# Patient Record
Sex: Female | Born: 1935 | Race: White | Hispanic: No | Smoking: Current every day smoker
Health system: Southern US, Community
[De-identification: ages and names within clinical notes are randomized; demographics above are authoritative.]

## PROBLEM LIST (undated history)

## (undated) DIAGNOSIS — I6529 Occlusion and stenosis of unspecified carotid artery: Secondary | ICD-10-CM

## (undated) DIAGNOSIS — E785 Hyperlipidemia, unspecified: Secondary | ICD-10-CM

## (undated) DIAGNOSIS — M810 Age-related osteoporosis without current pathological fracture: Secondary | ICD-10-CM

## (undated) DIAGNOSIS — E569 Vitamin deficiency, unspecified: Secondary | ICD-10-CM

## (undated) DIAGNOSIS — F0151 Vascular dementia with behavioral disturbance: Secondary | ICD-10-CM

## (undated) DIAGNOSIS — H409 Unspecified glaucoma: Secondary | ICD-10-CM

## (undated) DIAGNOSIS — G2581 Restless legs syndrome: Secondary | ICD-10-CM

## (undated) DIAGNOSIS — R634 Abnormal weight loss: Secondary | ICD-10-CM

## (undated) DIAGNOSIS — F329 Major depressive disorder, single episode, unspecified: Principal | ICD-10-CM

## (undated) DIAGNOSIS — I739 Peripheral vascular disease, unspecified: Secondary | ICD-10-CM

## (undated) DIAGNOSIS — F0150 Vascular dementia without behavioral disturbance: Secondary | ICD-10-CM

## (undated) DIAGNOSIS — F0153 Vascular dementia, unspecified severity, with mood disturbance: Secondary | ICD-10-CM

## (undated) HISTORY — DX: Vascular dementia with behavioral disturbance: F01.51

## (undated) HISTORY — DX: Vitamin deficiency, unspecified: E56.9

## (undated) HISTORY — DX: Age-related osteoporosis without current pathological fracture: M81.0

## (undated) HISTORY — DX: Occlusion and stenosis of unspecified carotid artery: I65.29

## (undated) HISTORY — DX: Vascular dementia without behavioral disturbance: F01.50

## (undated) HISTORY — PX: APPENDECTOMY: SHX54

## (undated) HISTORY — DX: Major depressive disorder, single episode, unspecified: F32.9

## (undated) HISTORY — DX: Peripheral vascular disease, unspecified: I73.9

## (undated) HISTORY — DX: Unspecified glaucoma: H40.9

## (undated) HISTORY — DX: Abnormal weight loss: R63.4

## (undated) HISTORY — DX: Hyperlipidemia, unspecified: E78.5

## (undated) HISTORY — DX: Restless legs syndrome: G25.81

## (undated) HISTORY — DX: Vascular dementia, unspecified severity, with mood disturbance: F01.53

---

## 1968-07-14 HISTORY — PX: CAROTID ENDARTERECTOMY: SUR193

## 2006-11-16 HISTORY — PX: COLONOSCOPY: SHX174

## 2007-08-25 ENCOUNTER — Ambulatory Visit: Payer: Self-pay | Admitting: *Deleted

## 2007-09-13 ENCOUNTER — Ambulatory Visit: Payer: Self-pay | Admitting: Vascular Surgery

## 2010-04-15 ENCOUNTER — Encounter: Admission: RE | Admit: 2010-04-15 | Discharge: 2010-04-15 | Payer: Self-pay | Admitting: Family Medicine

## 2010-04-21 ENCOUNTER — Encounter: Admission: RE | Admit: 2010-04-21 | Discharge: 2010-04-21 | Payer: Self-pay | Admitting: Family Medicine

## 2010-05-20 ENCOUNTER — Emergency Department (HOSPITAL_COMMUNITY): Admission: EM | Admit: 2010-05-20 | Discharge: 2010-05-20 | Payer: Self-pay | Admitting: Emergency Medicine

## 2011-03-31 NOTE — Procedures (Signed)
CAROTID DUPLEX EXAM   INDICATION:  Left carotid bruit.  Patient is also status post a right  carotid endarterectomy in the past in [place], [place].   HISTORY:  Diabetes:  No.  Cardiac:  No.  Hypertension:  No.  Smoking:  Yes, one pack per day for 60+ years.  Previous Surgery:  Right carotid endarterectomy in the past in  [place], [place].  CV History:  No  Amaurosis Fugax No, Paresthesias No, Hemiparesis No                                       RIGHT             LEFT  Brachial systolic pressure:         189               196  Brachial Doppler waveforms:         Biphasic          Biphasic  Vertebral direction of flow:        Antegrade         Antegrade  DUPLEX VELOCITIES (cm/sec)  CCA peak systolic                   88                76  ECA peak systolic                   56                129  ICA peak systolic                   64                89  ICA end diastolic                   19                24  PLAQUE MORPHOLOGY:                  Mixed             Calcified with  shadowing on the left  PLAQUE AMOUNT:                      Mild              Moderate  PLAQUE LOCATION:                    Distal CCA, ICA   ICA, ECA   IMPRESSION:  1. 20-39% right internal carotid artery stenosis, status post      endarterectomy.  2. 20-39% internal carotid artery stenosis; however, calcified plaque      with shadowing could obscure a more severe stenosis.   A preliminary copy was faxed to Dr. Stephannie Peters office.   ___________________________________________  Seth Bake. Charlena Cross, MD   DP/MEDQ  D:  08/25/2007  T:  08/26/2007  Job:  [id number]

## 2011-03-31 NOTE — Consult Note (Signed)
VASCULAR SURGERY CONSULTATION   [place], Minnetta  DOB:  1924/06/01                                       09/13/2007  CHART#:[id number]   HISTORY OF PRESENT ILLNESS:  I saw Ms. Ayre in the office today in  consultation for her peripheral vascular disease.  She was referred by  Dr. Smith Mince.  This is a pleasant 77 year old woman who had undergone a  right carotid endarterectomy in [place] in the past.  She has done  well from that standpoint.  She had developed pain in the lower  extremities, most significantly in the left knee.  She was referred for  vascular consultation.  Of note, she has no history of stroke, TIA,  expressive or receptive aphasia, or amaurosis fugax.  I did not get any  clear cut history of calf claudication, rest pain, or nonhealing wounds.  Her pain is mostly in the left knee and also intermittently in the left  thigh.  Lying down and sitting seem to help the pain.  Sometimes walking  actually helps the pain in the knee.  She does have a history of lumbar  disk disease also.   PAST MEDICAL HISTORY:  Hyperlipidemia.  She denies any history of  diabetes, hypertension, history of previous myocardial infarction,  history of congestive heart failure, or history of COPD.   FAMILY HISTORY:  There is no history of premature cardiovascular  disease.   SOCIAL HISTORY:  She is widowed.  She has one child.  She smokes a pack  per day of cigarettes and has been smoking for 60 years.   MEDICATIONS:  Listed on her medical history form in her chart.   REVIEW OF SYSTEMS:  GI:  She has had problems with intermittent  constipation.  VASCULAR:  She has had no history of DVT or phlebitis.  She has had no  real history of claudication or rest pain.  ORTHOPAEDIC:  She does have a history of arthritis joint pain and muscle  pain.  GENERAL, CARDIAC, PULMONARY, GU, NEUROLOGIC, PSYCHIATRY, ENT AND  HEMATOLOGIC:  Unremarkable as documented on the medical  history form in  her chart.   PHYSICAL EXAMINATION:  Vital signs:  Blood pressure 179/75, heart rate  98.  HEENT:  There is no cervical lymphadenopathy.  I did not detect any  carotid bruits.  Chest:  Lungs are clear to auscultation bilaterally to  auscultation.  Cardiac:  She has a regular rate and rhythm.  Abdomen:  Soft and nontender.  Vascular:  She has no femoral pulses.  I could not  palpate popliteal or pedal pulses on either side.  Extremities:  No  significant lower extremity swelling.  Neurologic:  She has good  strength in her upper extremities and lower extremities bilaterally.   LABORATORY DATA:  Carotid duplex study shows less than 39% carotid  stenoses bilaterally.  Her right carotid endarterectomy site is widely  patent without evidence of recurrent stenosis.  She has had Doppler  studies in our office on August 25, 2007 which showed monophasic Doppler  signals in both feet and an ABI of 53% on the right and 52% on the left.   With respect to her carotids, I reassured her that she has no evidence  of recurrent carotid disease and I do not think routine followup studies  are necessary unless she develops any  recurrent symptoms.   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  I do not think this would  explain her knee pain or thigh pain and I think most of this could be  related to her back or to her arthritis.  I have encouraged her to stay  as active as possible and to walk as much as possible.  We have also  discussed the importance of tobacco cessation.   I will be happy to see her back at any time if her symptoms progress,  but at this point, I do not think revascularization would significantly  improve her symptoms.  Certainly if she continues to smoke, her  peripheral vascular disease could progress and this could become a more  significant issue.   Di Kindle. Edilia Bo, M.D.  Electronically  Signed  CSD/MEDQ  D:  09/13/2007  T:  09/14/2007  Job:  [id number]   cc:   Talmadge Coventry, M.D.

## 2013-03-02 ENCOUNTER — Ambulatory Visit (INDEPENDENT_AMBULATORY_CARE_PROVIDER_SITE_OTHER): Payer: Medicare PPO | Admitting: Internal Medicine

## 2013-03-02 ENCOUNTER — Encounter: Payer: Self-pay | Admitting: Internal Medicine

## 2013-03-02 VITALS — BP 146/78 | HR 52 | Temp 98.0°F | Resp 14 | Ht 61.5 in | Wt 118.2 lb

## 2013-03-02 DIAGNOSIS — I739 Peripheral vascular disease, unspecified: Secondary | ICD-10-CM

## 2013-03-02 DIAGNOSIS — E785 Hyperlipidemia, unspecified: Secondary | ICD-10-CM

## 2013-03-02 DIAGNOSIS — M810 Age-related osteoporosis without current pathological fracture: Secondary | ICD-10-CM

## 2013-03-02 DIAGNOSIS — F32A Depression, unspecified: Secondary | ICD-10-CM

## 2013-03-02 DIAGNOSIS — F17200 Nicotine dependence, unspecified, uncomplicated: Secondary | ICD-10-CM

## 2013-03-02 DIAGNOSIS — R634 Abnormal weight loss: Secondary | ICD-10-CM | POA: Insufficient documentation

## 2013-03-02 DIAGNOSIS — H409 Unspecified glaucoma: Secondary | ICD-10-CM

## 2013-03-02 DIAGNOSIS — I6529 Occlusion and stenosis of unspecified carotid artery: Secondary | ICD-10-CM | POA: Insufficient documentation

## 2013-03-02 DIAGNOSIS — F0153 Vascular dementia, unspecified severity, with mood disturbance: Secondary | ICD-10-CM | POA: Insufficient documentation

## 2013-03-02 DIAGNOSIS — F329 Major depressive disorder, single episode, unspecified: Secondary | ICD-10-CM | POA: Insufficient documentation
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
## 2013-03-03 LAB — CBC WITH DIFFERENTIAL/PLATELET
Basophils Absolute: 0 10*3/uL (ref 0.0–0.2)
Basos: 0 % (ref 0–3)
Eos: 1 % (ref 0–5)
Eosinophils Absolute: 0.1 10*3/uL (ref 0.0–0.4)
HCT: 43 % (ref 34.0–46.6)
Hemoglobin: 14.7 g/dL (ref 11.1–15.9)
Immature Grans (Abs): 0 10*3/uL (ref 0.0–0.1)
Immature Granulocytes: 0 % (ref 0–2)
Lymphocytes Absolute: 2.4 10*3/uL (ref 0.7–3.1)
Lymphs: 24 % (ref 14–46)
MCH: 31.3 pg (ref 26.6–33.0)
MCHC: 34.2 g/dL (ref 31.5–35.7)
MCV: 92 fL (ref 79–97)
Monocytes Absolute: 0.8 10*3/uL (ref 0.1–0.9)
Monocytes: 8 % (ref 4–12)
Neutrophils Absolute: 6.8 10*3/uL (ref 1.4–7.0)
Neutrophils Relative %: 67 % (ref 40–74)
RBC: 4.7 x10E6/uL (ref 3.77–5.28)
RDW: 12.5 % (ref 12.3–15.4)
WBC: 10.2 10*3/uL (ref 3.4–10.8)

## 2013-03-03 LAB — BASIC METABOLIC PANEL
BUN/Creatinine Ratio: 18 (ref 11–26)
BUN: 12 mg/dL (ref 8–27)
CO2: 27 mmol/L (ref 19–28)
Calcium: 9.8 mg/dL (ref 8.6–10.2)
Chloride: 101 mmol/L (ref 97–108)
Creatinine, Ser: 0.68 mg/dL (ref 0.57–1.00)
GFR calc Af Amer: 91 mL/min/{1.73_m2} (ref >59)
GFR calc non Af Amer: 79 mL/min/{1.73_m2} (ref >59)
Glucose: 82 mg/dL (ref 65–99)
Potassium: 4.9 mmol/L (ref 3.5–5.2)
Sodium: 141 mmol/L (ref 134–144)

## 2013-03-20 ENCOUNTER — Other Ambulatory Visit: Payer: Self-pay | Admitting: Internal Medicine

## 2013-03-21 ENCOUNTER — Other Ambulatory Visit: Payer: Self-pay | Admitting: Internal Medicine

## 2013-05-24 ENCOUNTER — Encounter: Payer: Self-pay | Admitting: *Deleted

## 2013-05-25 ENCOUNTER — Encounter: Payer: Self-pay | Admitting: Internal Medicine

## 2013-05-25 ENCOUNTER — Ambulatory Visit (INDEPENDENT_AMBULATORY_CARE_PROVIDER_SITE_OTHER): Payer: Medicare PPO | Admitting: Internal Medicine

## 2013-05-25 VITALS — BP 152/78 | HR 75 | Temp 97.4°F | Resp 16 | Ht 61.5 in | Wt 115.0 lb

## 2013-05-25 DIAGNOSIS — E785 Hyperlipidemia, unspecified: Secondary | ICD-10-CM

## 2013-05-25 DIAGNOSIS — F32A Depression, unspecified: Secondary | ICD-10-CM

## 2013-05-25 DIAGNOSIS — F17200 Nicotine dependence, unspecified, uncomplicated: Secondary | ICD-10-CM

## 2013-05-25 DIAGNOSIS — R634 Abnormal weight loss: Secondary | ICD-10-CM

## 2013-05-25 DIAGNOSIS — F329 Major depressive disorder, single episode, unspecified: Secondary | ICD-10-CM

## 2013-05-25 DIAGNOSIS — F0151 Vascular dementia with behavioral disturbance: Secondary | ICD-10-CM

## 2013-05-25 DIAGNOSIS — I739 Peripheral vascular disease, unspecified: Secondary | ICD-10-CM

## 2013-05-25 DIAGNOSIS — F0153 Vascular dementia, unspecified severity, with mood disturbance: Secondary | ICD-10-CM

## 2013-05-25 DIAGNOSIS — M810 Age-related osteoporosis without current pathological fracture: Secondary | ICD-10-CM

## 2013-05-25 NOTE — Assessment & Plan Note (Signed)
High risk for fracture.  Is on vitamin D.  Needs bisphosphonate.  Need to address at f/u.

## 2013-05-25 NOTE — Assessment & Plan Note (Signed)
Has stopped her protein shakes.  Discussed resuming.  Cont remeron.  Would benefit from AL or continued help at home

## 2013-05-25 NOTE — Assessment & Plan Note (Signed)
Stable at present.  Quitting smoking would be most helpful for pain during ambulation, but she is not interested in quitting.

## 2013-05-25 NOTE — Patient Instructions (Addendum)
Needs assisted living placement for safety reasons.
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
## 2013-05-25 NOTE — Assessment & Plan Note (Signed)
Stable, on crestor due to vascular etiology to dementia and ongoing smoking.
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
## 2013-05-25 NOTE — Assessment & Plan Note (Signed)
Continues to smoke with no desire to quit.

## 2013-08-03 ENCOUNTER — Other Ambulatory Visit: Payer: Self-pay | Admitting: Internal Medicine

## 2013-09-04 ENCOUNTER — Other Ambulatory Visit: Payer: Self-pay | Admitting: *Deleted

## 2013-09-04 MED ORDER — DONEPEZIL HCL 10 MG PO TABS: ORAL_TABLET | ORAL | Status: DC

## 2013-09-21 ENCOUNTER — Other Ambulatory Visit: Payer: Self-pay | Admitting: Internal Medicine

## 2013-09-28 ENCOUNTER — Ambulatory Visit (INDEPENDENT_AMBULATORY_CARE_PROVIDER_SITE_OTHER): Payer: Medicare PPO | Admitting: Internal Medicine

## 2013-09-28 ENCOUNTER — Other Ambulatory Visit: Payer: Self-pay | Admitting: Internal Medicine

## 2013-09-28 ENCOUNTER — Encounter: Payer: Self-pay | Admitting: Internal Medicine

## 2013-09-28 VITALS — BP 140/74 | HR 70 | Temp 97.9°F | Wt 115.8 lb

## 2013-09-28 DIAGNOSIS — F0151 Vascular dementia with behavioral disturbance: Secondary | ICD-10-CM

## 2013-09-28 DIAGNOSIS — E785 Hyperlipidemia, unspecified: Secondary | ICD-10-CM

## 2013-09-28 DIAGNOSIS — F0153 Vascular dementia, unspecified severity, with mood disturbance: Secondary | ICD-10-CM

## 2013-09-28 DIAGNOSIS — F32A Depression, unspecified: Secondary | ICD-10-CM

## 2013-09-28 DIAGNOSIS — M810 Age-related osteoporosis without current pathological fracture: Secondary | ICD-10-CM

## 2013-09-28 DIAGNOSIS — R269 Unspecified abnormalities of gait and mobility: Secondary | ICD-10-CM

## 2013-09-28 DIAGNOSIS — F17200 Nicotine dependence, unspecified, uncomplicated: Secondary | ICD-10-CM

## 2013-09-28 DIAGNOSIS — R2681 Unsteadiness on feet: Secondary | ICD-10-CM

## 2013-09-28 DIAGNOSIS — Z23 Encounter for immunization: Secondary | ICD-10-CM

## 2013-09-28 MED ORDER — TETANUS-DIPHTHERIA TOXOIDS TD 2-2 LF/0.5ML IM SUSP: 0.5000 mL | Freq: Once | INTRAMUSCULAR | Status: DC

## 2013-09-28 NOTE — Progress Notes (Signed)
Patient ID: Brittney Graham, female   DOB: 09-20-1924, 79 y.o.   MRN: [id number] Location:  [facility]  Code Status: DNR  No Known Allergies  Chief Complaint  Patient presents with  . Medical Managment of Chronic Issues    4 month f/u  . Immunizations    ? Pneumo, needs Tdap   . other    needs updated FL-2    HPI: Patient is a 79 y.o. female seen in the office today for medical management of chronic issues. Son needs an FL2 form completed, trying to get assisted living facility worked out. Has two places in mind, [facility] and [facility].  Pt has no complaints. Son states that she is getting a little less mobile, uses a cane now. Memory has declined. Son states that there will be a week or two where memory is really bad but then will return back to baseline.   Pt continues to smoke.  Has no desire to quit but will have to when she moves to AL.    Pt states that she is sleeping good. Continues to live alone, has a Programmer, applications than comes in to clean her house. Son cooks for her for dinner. She eats a breakfast bar in the morning, Cheese and crackers for lunch. Son states he has encouraged her to eat leftovers for lunch but she insists on cheese and crackers.  Review of Systems:  Review of Systems  Constitutional: Negative for fever, chills and weight loss.  Respiratory: Negative for shortness of breath.   Cardiovascular: Negative for chest pain.  Gastrointestinal: Negative for nausea, vomiting, diarrhea and constipation.  Genitourinary: Negative for dysuria, urgency and frequency.  Musculoskeletal: Negative for falls.  Psychiatric/Behavioral: Positive for memory loss. Negative for depression. The patient is not nervous/anxious.      Past Medical History  Diagnosis Date  . Peripheral vascular disease, unspecified   . Unspecified glaucoma(365.9)   . Vascular dementia with depressed mood   . Loss of weight   . Vitamin  deficiency   . Hyperlipidemia   . Restless legs syndrome (RLS)   . Occlusion and stenosis of carotid artery without mention of cerebral infarction   . Osteoporosis     Past Surgical History  Procedure Laterality Date  . Appendectomy    . Carotid endarterectomy  1969    Social History:   reports that she has been smoking Cigarettes.  She has a 50 pack-year smoking history. She does not have any smokeless tobacco history on file. She reports that she does not drink alcohol or use illicit drugs.  No family history on file.  Medications: Patient's Medications  New Prescriptions   No medications on file  Previous Medications   ASPIRIN 81 MG TABLET    Take 81 mg by mouth daily.   BIMATOPROST (LUMIGAN) 0.03 % OPHTHALMIC SOLUTION    1 drop at bedtime.   BRIMONIDINE (ALPHAGAN) 0.15 % OPHTHALMIC SOLUTION    1 drop 2 (two) times daily.   CHOLECALCIFEROL (VITAMIN D) 1000 UNITS TABLET    Take 1,000 Units by mouth daily.   CRESTOR 10 MG TABLET    TAKE 1 TABLET EVERY DAY FOR CHOLESTEROL   DIPTHERIA-TETANUS TOXOIDS (DECAVAC) 2-2 LF/0.5ML INJECTION    Inject 0.5 mLs into the muscle once.   DONEPEZIL (ARICEPT) 10 MG TABLET    Take one tablet once daily for memory   DORZOLAMIDE-TIMOLOL (COSOPT) 22.3-6.8 MG/ML OPHTHALMIC SOLUTION    1 drop  2 (two) times daily.   MIRTAZAPINE (REMERON) 7.5 MG TABLET    TAKE 1 TABLET BY MOUTH AT BEDTIME FOR DEPRESSION APPETITE  Modified Medications   No medications on file  Discontinued Medications   No medications on file     Physical Exam: Filed Vitals:   09/28/13 1556  BP: 140/74  Pulse: 70  Temp: 97.9 F (36.6 C)  TempSrc: Oral  Weight: 115 lb 12.8 oz (52.527 kg)  SpO2: 97%   *Physical Exam  Constitutional: She is oriented to person, place, and time.  Frail female, has skin changes suggestive of longstanding tobacco use with white nose and grayish skin tone;  Poor hygiene--clothes dirty, skin dirty, smells strongly of smoke  Cardiovascular: Normal  rate, regular rhythm, normal heart sounds and intact distal pulses.   Pulmonary/Chest: Effort normal and breath sounds normal. She has no wheezes.  Prolonged expiratory phase  Abdominal: Soft. Bowel sounds are normal.  Neurological: She is alert and oriented to person, place, and time.  Skin: Skin is warm and dry.  Psychiatric: She has a normal mood and affect.     Labs reviewed: Basic Metabolic Panel:  Recent Labs  62/13/08 1503  NA 141  K 4.9  CL 101  CO2 27  GLUCOSE 82  BUN 12  CREATININE 0.68  CALCIUM 9.8   CBC:  Recent Labs  03/02/13 1503  WBC 10.2  NEUTROABS 6.8  HGB 14.7  HCT 43.0  MCV 92   Assessment/Plan 1. Need for prophylactic vaccination with combined diphtheria-tetanus-pertussis (DTP) vaccine - diptheria-tetanus toxoids (DECAVAC) 2-2 LF/0.5ML injection; Inject 0.5 mLs into the muscle once.  Dispense: 0.5 mL; Refill: 0  2. Tobacco use disorder - Continues to smoke cigarettes with no desire to quit -will need patches when she goes to AL  3. Vascular dementia with depressed mood - Memory is getting worse according to son -Behavior remains stable - Continue Aricept, add namenda in 3 mos or when she moves into AL if possible -FL2 completed for possible [facility] admission for AL  4. Osteoporosis - Continue taking calcium and Vit D--increase D to 2000 units daily on fl2 -Continues to smoke cigarettes  5. Hyperlipidemia - Continue Crestor -should f/u lipids at 3 mo visit to determine if this is needed with her frailty  6.  Gait instablility -recommended walker--has it but will not use it;  Does use cane at home  Labs/tests ordered:  Cbc, cmp Next appt:  3 mos

## 2013-09-29 ENCOUNTER — Encounter: Payer: Self-pay | Admitting: *Deleted

## 2013-09-29 LAB — CBC WITH DIFFERENTIAL/PLATELET
Basophils Absolute: 0 10*3/uL (ref 0.0–0.2)
Basos: 1 %
Eos: 1 %
Eosinophils Absolute: 0.1 10*3/uL (ref 0.0–0.4)
HCT: 43.1 % (ref 34.0–46.6)
Hemoglobin: 14.1 g/dL (ref 11.1–15.9)
Immature Grans (Abs): 0 10*3/uL (ref 0.0–0.1)
Immature Granulocytes: 0 %
Lymphocytes Absolute: 2.2 10*3/uL (ref 0.7–3.1)
Lymphs: 26 %
MCH: 31.3 pg (ref 26.6–33.0)
MCHC: 32.7 g/dL (ref 31.5–35.7)
MCV: 96 fL (ref 79–97)
Monocytes Absolute: 0.6 10*3/uL (ref 0.1–0.9)
Monocytes: 8 %
Neutrophils Absolute: 5.6 10*3/uL (ref 1.4–7.0)
Neutrophils Relative %: 64 %
RBC: 4.51 x10E6/uL (ref 3.77–5.28)
RDW: 13 % (ref 12.3–15.4)
WBC: 8.6 10*3/uL (ref 3.4–10.8)

## 2013-09-29 LAB — COMPREHENSIVE METABOLIC PANEL
ALT: 15 IU/L (ref 0–32)
AST: 21 IU/L (ref 0–40)
Albumin/Globulin Ratio: 1.8 (ref 1.1–2.5)
Albumin: 4.2 g/dL (ref 3.5–4.7)
Alkaline Phosphatase: 76 IU/L (ref 39–117)
BUN/Creatinine Ratio: 24 (ref 11–26)
BUN: 18 mg/dL (ref 8–27)
CO2: 27 mmol/L (ref 18–29)
Calcium: 9.7 mg/dL (ref 8.6–10.2)
Chloride: 102 mmol/L (ref 97–108)
Creatinine, Ser: 0.74 mg/dL (ref 0.57–1.00)
GFR calc Af Amer: 84 mL/min/{1.73_m2} (ref >59)
GFR calc non Af Amer: 73 mL/min/{1.73_m2} (ref >59)
Globulin, Total: 2.4 g/dL (ref 1.5–4.5)
Glucose: 89 mg/dL (ref 65–99)
Potassium: 4.7 mmol/L (ref 3.5–5.2)
Sodium: 141 mmol/L (ref 134–144)
Total Bilirubin: 0.3 mg/dL (ref 0.0–1.2)
Total Protein: 6.6 g/dL (ref 6.0–8.5)

## 2014-01-01 ENCOUNTER — Ambulatory Visit: Payer: Medicare PPO | Admitting: Internal Medicine

## 2014-01-03 ENCOUNTER — Other Ambulatory Visit: Payer: Self-pay | Admitting: Internal Medicine

## 2014-03-05 ENCOUNTER — Other Ambulatory Visit: Payer: Self-pay | Admitting: Nurse Practitioner

## 2014-04-03 ENCOUNTER — Other Ambulatory Visit: Payer: Self-pay | Admitting: Internal Medicine

## 2014-04-16 ENCOUNTER — Other Ambulatory Visit: Payer: Self-pay | Admitting: Nurse Practitioner

## 2014-04-19 ENCOUNTER — Encounter: Payer: Self-pay | Admitting: Nurse Practitioner

## 2014-04-19 ENCOUNTER — Ambulatory Visit (INDEPENDENT_AMBULATORY_CARE_PROVIDER_SITE_OTHER): Payer: Medicare PPO | Admitting: Nurse Practitioner

## 2014-04-19 VITALS — BP 138/78 | HR 66 | Temp 98.1°F | Resp 10 | Ht 60.0 in | Wt 118.0 lb

## 2014-04-19 DIAGNOSIS — F329 Major depressive disorder, single episode, unspecified: Secondary | ICD-10-CM

## 2014-04-19 DIAGNOSIS — F0153 Vascular dementia, unspecified severity, with mood disturbance: Secondary | ICD-10-CM

## 2014-04-19 DIAGNOSIS — F17200 Nicotine dependence, unspecified, uncomplicated: Secondary | ICD-10-CM

## 2014-04-19 DIAGNOSIS — R634 Abnormal weight loss: Secondary | ICD-10-CM

## 2014-04-19 DIAGNOSIS — F0151 Vascular dementia with behavioral disturbance: Secondary | ICD-10-CM

## 2014-04-19 DIAGNOSIS — E785 Hyperlipidemia, unspecified: Secondary | ICD-10-CM

## 2014-04-19 DIAGNOSIS — H409 Unspecified glaucoma: Secondary | ICD-10-CM

## 2014-04-19 NOTE — Progress Notes (Signed)
Patient ID: Brittney Graham, female   DOB: [birth year], 80 y.o.   MRN: [id number]    No Known Allergies  Chief Complaint  Patient presents with  . Form Completion    FL2 form completion     HPI: Patient is a 80 y.o. female seen in the office today for FL2 form to be filled out, lives a block from son. He will be out of town for 2 weeks and placement while he is gone. ultimately looking for assisted living for long term care. Pt is very unsteady and is worried about falls. She is unable to prepare meals, gets mobile meals and her son heats meal- pt having more difficulty with microwave Son is concerned about smoking- she smokes all the time and smokes inside her house.   Review of Systems:  Review of Systems  Constitutional: Negative for fever, chills and weight loss.  Respiratory: Negative for shortness of breath.   Cardiovascular: Negative for chest pain.  Gastrointestinal: Negative for nausea, vomiting, diarrhea and constipation.  Genitourinary: Negative for dysuria, urgency and frequency.  Musculoskeletal: Negative for falls.  Psychiatric/Behavioral: Positive for memory loss. Negative for depression. The patient is not nervous/anxious.      Past Medical History  Diagnosis Date  . Peripheral vascular disease, unspecified   . Unspecified glaucoma   . Vascular dementia with depressed mood   . Loss of weight   . Vitamin deficiency   . Hyperlipidemia   . Restless legs syndrome (RLS)   . Occlusion and stenosis of carotid artery without mention of cerebral infarction   . Osteoporosis    Past Surgical History  Procedure Laterality Date  . Appendectomy    . Carotid endarterectomy  07/14/1968  . Colonoscopy  2008    Normal    Social History:   reports that she has been smoking Cigarettes.  She has a 100 pack-year smoking history. She does not have any smokeless tobacco history on file. She reports that she does not drink alcohol or use illicit drugs.  History reviewed. No  pertinent family history.  Medications: Patient's Medications  New Prescriptions   No medications on file  Previous Medications   ASPIRIN 81 MG TABLET    Take 81 mg by mouth daily.   BIMATOPROST (LUMIGAN) 0.03 % OPHTHALMIC SOLUTION    1 drop at bedtime.   BRIMONIDINE (ALPHAGAN) 0.15 % OPHTHALMIC SOLUTION    1 drop 2 (two) times daily.   CHOLECALCIFEROL (VITAMIN D) 1000 UNITS TABLET    Take 1,000 Units by mouth daily.   CRESTOR 10 MG TABLET    TAKE 1 TABLET EVERY DAY FOR CHOLESTEROL   DIPTHERIA-TETANUS TOXOIDS (DECAVAC) 2-2 LF/0.5ML INJECTION    Inject 0.5 mLs into the muscle once.   DONEPEZIL (ARICEPT) 10 MG TABLET    Take one tablet every day for memory    DORZOLAMIDE-TIMOLOL (COSOPT) 22.3-6.8 MG/ML OPHTHALMIC SOLUTION    1 drop 2 (two) times daily.   MIRTAZAPINE (REMERON) 7.5 MG TABLET    TAKE 1 TABLET BY MOUTH AT BEDTIME FOR DEPRESSION APPETITE  Modified Medications   No medications on file  Discontinued Medications   No medications on file     Physical Exam:  Filed Vitals:   04/19/14 1118  BP: 138/78  Pulse: 66  Temp: 98.1 F (36.7 C)  TempSrc: Oral  Resp: 10  Height: 5' (1.524 m)  Weight: 118 lb (53.524 kg)  SpO2: 98%    Physical Exam  Constitutional:  Thin female NAD- strong smell  of smoke  HENT:  Mouth/Throat: Oropharynx is clear and moist. No oropharyngeal exudate.  Eyes: Conjunctivae and EOM are normal. Pupils are equal, round, and reactive to light.  Neck: Normal range of motion. Neck supple.  Cardiovascular: Normal rate, regular rhythm and normal heart sounds.   Pulmonary/Chest: Effort normal and breath sounds normal. No respiratory distress.  Abdominal: Soft. Bowel sounds are normal.  Musculoskeletal: She exhibits no edema.  Neurological: She is alert.  Skin: Skin is warm and dry.  Psychiatric: Affect normal.     Labs reviewed: Basic Metabolic Panel:  Recent Labs  09/28/13 1659  NA 141  K 4.7  CL 102  CO2 27  GLUCOSE 89  BUN 18    CREATININE 0.74  CALCIUM 9.7   Liver Function Tests:  Recent Labs  09/28/13 1659  AST 21  ALT 15  ALKPHOS 76  BILITOT 0.3  PROT 6.6   No results found for this basename: LIPASE, AMYLASE,  in the last 8760 hours No results found for this basename: AMMONIA,  in the last 8760 hours CBC:  Recent Labs  09/28/13 1659  WBC 8.6  NEUTROABS 5.6  HGB 14.1  HCT 43.1  MCV 96     Assessment/Plan  1. Loss of weight -conts to eat minimally, reports decrease taste (conts to smoke 2 packs a day) -conts on remeron   2. Tobacco use disorder -son looking into facility that will take her due to her 2 pack a day smoking habit, pt refuses to cut back, encouraged her to cut back  3. Unspecified glaucoma(365.9) -conts on alphagan, lumigan, and cosopt  4. Vascular dementia with depressed mood -conts on aricept 10 mg qhs  5. Hyperlipidemia conts on crestor  FL2 form completed and signed by Dr Nyoka Cowden, MD. Pt for temporary placement in [place] but son looking for more long term for better care and supervision due to pts decline in memory and mobility

## 2014-05-17 ENCOUNTER — Ambulatory Visit (INDEPENDENT_AMBULATORY_CARE_PROVIDER_SITE_OTHER): Payer: Medicare PPO | Admitting: Internal Medicine

## 2014-05-17 ENCOUNTER — Encounter: Payer: Self-pay | Admitting: Internal Medicine

## 2014-05-17 VITALS — BP 134/62 | HR 76 | Wt 115.0 lb

## 2014-05-17 DIAGNOSIS — F32A Depression, unspecified: Secondary | ICD-10-CM

## 2014-05-17 DIAGNOSIS — F17200 Nicotine dependence, unspecified, uncomplicated: Secondary | ICD-10-CM

## 2014-05-17 DIAGNOSIS — I6529 Occlusion and stenosis of unspecified carotid artery: Secondary | ICD-10-CM

## 2014-05-17 DIAGNOSIS — F329 Major depressive disorder, single episode, unspecified: Principal | ICD-10-CM

## 2014-05-17 DIAGNOSIS — M810 Age-related osteoporosis without current pathological fracture: Secondary | ICD-10-CM

## 2014-05-17 DIAGNOSIS — R634 Abnormal weight loss: Secondary | ICD-10-CM

## 2014-05-17 DIAGNOSIS — F0153 Vascular dementia, unspecified severity, with mood disturbance: Secondary | ICD-10-CM

## 2014-05-17 DIAGNOSIS — F0151 Vascular dementia with behavioral disturbance: Secondary | ICD-10-CM

## 2014-05-17 DIAGNOSIS — E785 Hyperlipidemia, unspecified: Secondary | ICD-10-CM

## 2014-05-17 DIAGNOSIS — I739 Peripheral vascular disease, unspecified: Secondary | ICD-10-CM

## 2014-05-17 MED ORDER — MEMANTINE HCL ER 7 & 14 & 21 &28 MG PO CP24: ORAL_CAPSULE | ORAL | Status: DC

## 2014-05-17 MED ORDER — MEMANTINE HCL ER 28 MG PO CP24: 28.0000 mg | ORAL_CAPSULE | Freq: Every day | ORAL | Status: AC

## 2014-05-17 MED ORDER — MIRTAZAPINE 15 MG PO TABS: 15.0000 mg | ORAL_TABLET | Freq: Every day | ORAL | Status: AC

## 2014-05-17 NOTE — Progress Notes (Signed)
Patient ID: Mardene Celeste June Steveson, female   DOB: June 09, 1924, 80 y.o.   MRN: [id number]   Location:  [facility]  Code Status: DNR  No Known Allergies  Chief Complaint  Patient presents with  . Medical Management of Chronic Issues    dementra, depression 3 month follow-up    HPI: Patient is a 80 y.o. white female seen in the office today for medical mgt of chronic diseases.  Is now living at [facility].  No pain.  Sleeping well at night.  Hasn't stopped smoking, but does smoke less.  Breathing ok.  No falls.  Has life alert button now.  Has glaucoma.  Is having an unhappy day--feeling sorry for herself she says.  Denies urinary incontinence.    Review of Systems:  Review of Systems  Constitutional: Positive for weight loss and malaise/fatigue. Negative for fever.  HENT: Negative for congestion.   Eyes: Negative for blurred vision.  Respiratory: Negative for shortness of breath.   Cardiovascular: Negative for chest pain and leg swelling.  Gastrointestinal: Negative for abdominal pain.  Genitourinary: Positive for urgency. Negative for dysuria and frequency.  Musculoskeletal: Negative for falls and myalgias.  Skin: Negative for rash.  Neurological: Positive for weakness. Negative for dizziness and loss of consciousness.  Endo/Heme/Allergies: Does not bruise/bleed easily.  Psychiatric/Behavioral: Positive for depression and memory loss.     Past Medical History  Diagnosis Date  . Peripheral vascular disease, unspecified   . Unspecified glaucoma   . Vascular dementia with depressed mood   . Loss of weight   . Vitamin deficiency   . Hyperlipidemia   . Restless legs syndrome (RLS)   . Occlusion and stenosis of carotid artery without mention of cerebral infarction   . Osteoporosis     Past Surgical History  Procedure Laterality Date  . Appendectomy    . Carotid endarterectomy  07/14/1968  . Colonoscopy  2008    Normal     Social  History:   reports that she has been smoking Cigarettes.  She has a 100 pack-year smoking history. She does not have any smokeless tobacco history on file. She reports that she does not drink alcohol or use illicit drugs.  No family history on file.  Medications: Patient's Medications  New Prescriptions   No medications on file  Previous Medications   ASPIRIN 81 MG TABLET    Take 81 mg by mouth daily.   BIMATOPROST (LUMIGAN) 0.03 % OPHTHALMIC SOLUTION    1 drop at bedtime.   BRIMONIDINE (ALPHAGAN) 0.15 % OPHTHALMIC SOLUTION    1 drop 2 (two) times daily.   CHOLECALCIFEROL (VITAMIN D) 1000 UNITS TABLET    Take 1,000 Units by mouth daily.   CRESTOR 10 MG TABLET    TAKE 1 TABLET EVERY DAY FOR CHOLESTEROL   DONEPEZIL (ARICEPT) 10 MG TABLET    Take one tablet every day for memory    DORZOLAMIDE-TIMOLOL (COSOPT) 22.3-6.8 MG/ML OPHTHALMIC SOLUTION    1 drop 2 (two) times daily.   MIRTAZAPINE (REMERON) 7.5 MG TABLET    TAKE 1 TABLET BY MOUTH AT BEDTIME FOR DEPRESSION APPETITE  Modified Medications   No medications on file  Discontinued Medications   DIPTHERIA-TETANUS TOXOIDS (DECAVAC) 2-2 LF/0.5ML INJECTION    Inject 0.5 mLs into the muscle once.     Physical Exam: Filed Vitals:   05/17/14 1343  BP: 134/62  Pulse: 76  Weight: 115 lb (52.164 kg)  Physical Exam  Constitutional: She  is oriented to person, place, and time.  Thin white female  Cardiovascular: Normal rate, regular rhythm, normal heart sounds and intact distal pulses.   Pulmonary/Chest: Effort normal and breath sounds normal.  Abdominal: Soft. Bowel sounds are normal. She exhibits no distension and no mass. There is no tenderness.  Musculoskeletal: Normal range of motion. She exhibits no edema and no tenderness.  Has wide based gait  Neurological: She is alert and oriented to person, place, and time.  Skin: Skin is warm and dry. There is pallor.  Psychiatric: She has a normal mood and affect.    Labs reviewed: Basic  Metabolic Panel:  Recent Labs  09/28/13 1659  NA 141  K 4.7  CL 102  CO2 27  GLUCOSE 89  BUN 18  CREATININE 0.74  CALCIUM 9.7   Liver Function Tests:  Recent Labs  09/28/13 1659  AST 21  ALT 15  ALKPHOS 76  BILITOT 0.3  PROT 6.6  CBC:  Recent Labs  09/28/13 1659  WBC 8.6  NEUTROABS 5.6  HGB 14.1  HCT 43.1  MCV 96   Assessment/Plan 1. Vascular dementia with depressed mood -fortunately is now living in [place] b/c she was not safe at home -ambulating with walker also - CBC With differential/Platelet - Comprehensive metabolic panel  2. Tobacco use disorder -has cut back significantly due to new home, but not ready to quit  3. Osteoporosis - cont vitamin D supplement - Vitamin D, 25-hydroxy  4. Occlusion and stenosis of carotid artery without mention of cerebral infarction -has had several small strokes in the past causing her dementia -cont secondary prevention with crestor   5. Peripheral vascular disease, unspecified -due to tobacco abuse -pulses intact  6. Hyperlipidemia -cont crestor  7. Loss of weight -continues, failure to thrive due to dementia  Labs/tests ordered:   Orders Placed This Encounter  Procedures  . CBC With differential/Platelet  . Comprehensive metabolic panel  . Vitamin D, 25-hydroxy    Next appt:  3 mos

## 2014-05-18 LAB — CBC WITH DIFFERENTIAL
Basophils Absolute: 0 10*3/uL (ref 0.0–0.2)
Basos: 0 %
Eos: 1 %
Eosinophils Absolute: 0.1 10*3/uL (ref 0.0–0.4)
HCT: 40.7 % (ref 34.0–46.6)
Hemoglobin: 13.4 g/dL (ref 11.1–15.9)
Immature Grans (Abs): 0 10*3/uL (ref 0.0–0.1)
Immature Granulocytes: 0 %
Lymphocytes Absolute: 1.9 10*3/uL (ref 0.7–3.1)
Lymphs: 17 %
MCH: 30.9 pg (ref 26.6–33.0)
MCHC: 32.9 g/dL (ref 31.5–35.7)
MCV: 94 fL (ref 79–97)
Monocytes Absolute: 1 10*3/uL — ABNORMAL HIGH (ref 0.1–0.9)
Monocytes: 9 %
Neutrophils Absolute: 8.3 10*3/uL — ABNORMAL HIGH (ref 1.4–7.0)
Neutrophils Relative %: 73 %
Platelets: 258 10*3/uL (ref 150–379)
RBC: 4.33 x10E6/uL (ref 3.77–5.28)
RDW: 13.6 % (ref 12.3–15.4)
WBC: 11.4 10*3/uL — ABNORMAL HIGH (ref 3.4–10.8)

## 2014-05-18 LAB — COMPREHENSIVE METABOLIC PANEL
ALT: 12 IU/L (ref 0–32)
AST: 19 IU/L (ref 0–40)
Albumin/Globulin Ratio: 1.4 (ref 1.1–2.5)
Albumin: 3.9 g/dL (ref 3.5–4.7)
Alkaline Phosphatase: 89 IU/L (ref 39–117)
BUN/Creatinine Ratio: 15 (ref 11–26)
BUN: 13 mg/dL (ref 8–27)
CO2: 26 mmol/L (ref 18–29)
Calcium: 9.3 mg/dL (ref 8.7–10.3)
Chloride: 101 mmol/L (ref 97–108)
Creatinine, Ser: 0.84 mg/dL (ref 0.57–1.00)
GFR calc Af Amer: 72 mL/min/{1.73_m2} (ref >59)
GFR calc non Af Amer: 62 mL/min/{1.73_m2} (ref >59)
Globulin, Total: 2.8 g/dL (ref 1.5–4.5)
Glucose: 101 mg/dL — ABNORMAL HIGH (ref 65–99)
Potassium: 4.5 mmol/L (ref 3.5–5.2)
Sodium: 141 mmol/L (ref 134–144)
Total Bilirubin: 0.3 mg/dL (ref 0.0–1.2)
Total Protein: 6.7 g/dL (ref 6.0–8.5)

## 2014-05-18 LAB — VITAMIN D 25 HYDROXY (VIT D DEFICIENCY, FRACTURES): Vit D, 25-Hydroxy: 39.5 ng/mL (ref 30.0–100.0)

## 2015-04-04 ENCOUNTER — Encounter: Payer: Self-pay | Admitting: Internal Medicine

## 2015-04-04 ENCOUNTER — Ambulatory Visit (INDEPENDENT_AMBULATORY_CARE_PROVIDER_SITE_OTHER): Payer: Medicare PPO | Admitting: Internal Medicine

## 2015-04-04 VITALS — BP 120/82 | HR 62 | Temp 97.9°F | Resp 20 | Ht 60.0 in | Wt 102.4 lb

## 2015-04-04 DIAGNOSIS — R2681 Unsteadiness on feet: Secondary | ICD-10-CM | POA: Diagnosis not present

## 2015-04-04 DIAGNOSIS — F0151 Vascular dementia with behavioral disturbance: Secondary | ICD-10-CM

## 2015-04-04 DIAGNOSIS — Z720 Tobacco use: Secondary | ICD-10-CM

## 2015-04-04 DIAGNOSIS — F0153 Vascular dementia, unspecified severity, with mood disturbance: Secondary | ICD-10-CM

## 2015-04-04 DIAGNOSIS — F329 Major depressive disorder, single episode, unspecified: Secondary | ICD-10-CM

## 2015-04-04 DIAGNOSIS — R634 Abnormal weight loss: Secondary | ICD-10-CM

## 2015-04-04 DIAGNOSIS — F32A Depression, unspecified: Secondary | ICD-10-CM

## 2015-04-04 DIAGNOSIS — E785 Hyperlipidemia, unspecified: Secondary | ICD-10-CM | POA: Diagnosis not present

## 2015-04-04 DIAGNOSIS — C44320 Squamous cell carcinoma of skin of unspecified parts of face: Secondary | ICD-10-CM | POA: Diagnosis not present

## 2015-04-04 DIAGNOSIS — M810 Age-related osteoporosis without current pathological fracture: Secondary | ICD-10-CM | POA: Diagnosis not present

## 2015-04-04 DIAGNOSIS — L603 Nail dystrophy: Secondary | ICD-10-CM | POA: Diagnosis not present

## 2015-04-04 DIAGNOSIS — F17200 Nicotine dependence, unspecified, uncomplicated: Secondary | ICD-10-CM

## 2015-04-04 NOTE — Progress Notes (Signed)
Patient ID: Brittney Graham, female   DOB: 01-07-1924, 81 y.o.   MRN: [id number]   Location:  [facility] / [facility]  Code Status: DNR Goals of Care: Advanced Directive information     Chief Complaint  Patient presents with  . Follow-up    follow-up    HPI: Patient is a 81 y.o. white female seen in the office today for med mgt of chronic diseases.  She forgot she smoked which was fortunate.  Her son says things are going well at [facility] as far as he knows.  Has three meals a day, tv.  Her son isn't sure how often she attends activities.  She talks some to others in the facility.  In the last year, she's fallen twice (sat on floor and got back up herself once--has small cut on her wrist, other one, she fell, they picked her up and another small abrasion.  Denture got thrown out once.  Had to get new ones that fit better.  Has lost 13 lbs in the past year.  Does not always finish supper and then is hungry when gets back to her room.  Her toenails need trimming and her son says he will take care of them when they get back.    She has a small skin cancer on her left cheek.  Risks/benefits reviewed--they opted not to go through with referral given her dementia.    Review of Systems:  Review of Systems  Constitutional: Positive for weight loss. Negative for fever, chills and malaise/fatigue.  HENT: Negative for congestion.   Eyes: Negative for blurred vision (glasses).  Respiratory: Negative for shortness of breath.   Cardiovascular: Negative for chest pain and leg swelling.  Gastrointestinal: Negative for abdominal pain, constipation, blood in stool and melena.  Genitourinary: Negative for dysuria.       Urinary incontinence  Skin: Negative for rash.  Neurological: Positive for weakness. Negative for dizziness and loss of consciousness.  Endo/Heme/Allergies: Does not bruise/bleed easily.  Psychiatric/Behavioral: Positive for memory loss. Negative  for depression. The patient is not nervous/anxious and does not have insomnia.     Past Medical History  Diagnosis Date  . Peripheral vascular disease, unspecified   . Unspecified glaucoma   . Vascular dementia with depressed mood   . Loss of weight   . Vitamin deficiency   . Hyperlipidemia   . Restless legs syndrome (RLS)   . Occlusion and stenosis of carotid artery without mention of cerebral infarction   . Osteoporosis     Past Surgical History  Procedure Laterality Date  . Appendectomy    . Carotid endarterectomy  07/14/1968  . Colonoscopy  2008    Normal     No Known Allergies Medications: Patient's Medications  New Prescriptions   No medications on file  Previous Medications   ASPIRIN 81 MG TABLET    Take 81 mg by mouth daily.   BIMATOPROST (LUMIGAN) 0.03 % OPHTHALMIC SOLUTION    1 drop at bedtime.   BRIMONIDINE (ALPHAGAN) 0.15 % OPHTHALMIC SOLUTION    1 drop 2 (two) times daily.   CHOLECALCIFEROL (VITAMIN D) 1000 UNITS TABLET    Take 1,000 Units by mouth daily.   CRESTOR 10 MG TABLET    TAKE 1 TABLET EVERY DAY FOR CHOLESTEROL   DONEPEZIL (ARICEPT) 10 MG TABLET    Take one tablet every day for memory    DORZOLAMIDE-TIMOLOL (COSOPT) 22.3-6.8 MG/ML OPHTHALMIC SOLUTION    1 drop 2 (  two) times daily.   MEMANTINE HCL ER (NAMENDA XR TITRATION PACK) 7 & 14 & 21 &28 MG CP24    Use as directed   MEMANTINE HCL ER (NAMENDA XR) 28 MG CP24    Take 28 mg by mouth daily.   MIRTAZAPINE (REMERON) 15 MG TABLET    Take 1 tablet (15 mg total) by mouth at bedtime.  Modified Medications   No medications on file  Discontinued Medications   No medications on file    Physical Exam: Filed Vitals:   04/04/15 1459  BP: 120/82  Pulse: 62  Temp: 97.9 F (36.6 C)  TempSrc: Oral  Resp: 20  Height: 5' (1.524 m)  Weight: 102 lb 6.4 oz (46.448 kg)  SpO2: 97%   Physical Exam  Constitutional: No distress.  Frail white female with wide-based unsteady gait with rollator walker    Cardiovascular: Normal rate, regular rhythm, normal heart sounds and intact distal pulses.   Pulmonary/Chest: Effort normal.  Poor respiratory effort  Abdominal: Soft. Bowel sounds are normal. She exhibits no distension and no mass. There is no tenderness.  Musculoskeletal: Normal range of motion.  Neurological: She is alert.  Skin: Skin is warm and dry.  Psychiatric: She has a normal mood and affect.    Labs reviewed: Basic Metabolic Panel:  Recent Labs  05/17/14 1418  NA 141  K 4.5  CL 101  CO2 26  GLUCOSE 101*  BUN 13  CREATININE 0.84  CALCIUM 9.3   Liver Function Tests:  Recent Labs  05/17/14 1418  AST 19  ALT 12  ALKPHOS 89  BILITOT 0.3  PROT 6.7   No results for input(s): LIPASE, AMYLASE in the last 8760 hours. No results for input(s): AMMONIA in the last 8760 hours. CBC:  Recent Labs  05/17/14 1418  WBC 11.4*  NEUTROABS 8.3*  HGB 13.4  HCT 40.7  MCV 94  PLT 258   Lipid Panel: No results for input(s): CHOL, HDL, LDLCALC, TRIG, CHOLHDL, LDLDIRECT in the last 8760 hours. No results found for: HGBA1C  Assessment/Plan 1. Vascular dementia with depressed mood -has progressed since visit 10 mos ago -cont namenda XR and aricept she is on -tolerating well at this time -doing better overall since living at [place] where she can socialize with others  2. Tobacco use disorder -had to quit smoking to live at [facility], but she no longer remembers that she does smokes at all  3. Osteoporosis -cont vitamin D supplement  4. Hyperlipidemia -cont statin therapy--check lipids today NONfasting to see if she needs this anymore - CBC with Differential/Platelet - Lipid panel  5. Loss of weight -advised [facility] to give ensure chocolate at each meal where she eats less than 1/2 - Comprehensive metabolic panel - TSH  6. Gait instability -using rollator walker -only 2 known falls in past year--pretty good -I've always been suspicious that she has NPH, but she  has never been a good candidate for a large volume lumbar puncture due to her advanced stage of dementia  7. Onychodystrophy -recommended she see podiatry for nails to be trimmed--her son says he has the tools to do it today when they get back to her apt  8. Squamous cell carcinoma, face -we opted to avoid derm referral due to her dementia  Labs/tests ordered: Orders Placed This Encounter  Procedures  . CBC with Differential/Platelet  . Comprehensive metabolic panel    Order Specific Question:  Has the patient fasted?    Answer:  No  .  Lipid panel    Order Specific Question:  Has the patient fasted?    Answer:  No  . TSH    Next appt:  6 mos for med mgt  Rosalea Withrow L. Essynce Munsch, D.O. [facility] [address],  [address] Cell Phone (Mon-Fri 8am-5pm):  [phone] On Call:  [phone] & follow prompts after 5pm & weekends Office Phone:  [phone] Office Fax:  [phone]

## 2015-04-05 LAB — CBC WITH DIFFERENTIAL/PLATELET
Basophils Absolute: 0 10*3/uL (ref 0.0–0.2)
Basos: 1 %
EOS (ABSOLUTE): 0.2 10*3/uL (ref 0.0–0.4)
Eos: 2 %
Hematocrit: 39.9 % (ref 34.0–46.6)
Hemoglobin: 13 g/dL (ref 11.1–15.9)
Immature Grans (Abs): 0 10*3/uL (ref 0.0–0.1)
Immature Granulocytes: 0 %
Lymphocytes Absolute: 1.4 10*3/uL (ref 0.7–3.1)
Lymphs: 21 %
MCH: 30.9 pg (ref 26.6–33.0)
MCHC: 32.6 g/dL (ref 31.5–35.7)
MCV: 95 fL (ref 79–97)
Monocytes Absolute: 0.5 10*3/uL (ref 0.1–0.9)
Monocytes: 7 %
Neutrophils Absolute: 4.8 10*3/uL (ref 1.4–7.0)
Neutrophils: 69 %
Platelets: 198 10*3/uL (ref 150–379)
RBC: 4.21 x10E6/uL (ref 3.77–5.28)
RDW: 14.4 % (ref 12.3–15.4)
WBC: 6.9 10*3/uL (ref 3.4–10.8)

## 2015-04-05 LAB — COMPREHENSIVE METABOLIC PANEL
ALT: 18 IU/L (ref 0–32)
AST: 32 IU/L (ref 0–40)
Albumin/Globulin Ratio: 1.4 (ref 1.1–2.5)
Albumin: 3.8 g/dL (ref 3.5–4.7)
Alkaline Phosphatase: 95 IU/L (ref 39–117)
BUN/Creatinine Ratio: 18 (ref 11–26)
BUN: 15 mg/dL (ref 8–27)
Bilirubin Total: 0.3 mg/dL (ref 0.0–1.2)
CO2: 23 mmol/L (ref 18–29)
Calcium: 9.3 mg/dL (ref 8.7–10.3)
Chloride: 104 mmol/L (ref 97–108)
Creatinine, Ser: 0.85 mg/dL (ref 0.57–1.00)
GFR calc Af Amer: 70 mL/min/{1.73_m2} (ref >59)
GFR calc non Af Amer: 61 mL/min/{1.73_m2} (ref >59)
Globulin, Total: 2.8 g/dL (ref 1.5–4.5)
Glucose: 123 mg/dL — ABNORMAL HIGH (ref 65–99)
Potassium: 4.2 mmol/L (ref 3.5–5.2)
Sodium: 142 mmol/L (ref 134–144)
Total Protein: 6.6 g/dL (ref 6.0–8.5)

## 2015-04-05 LAB — LIPID PANEL
Chol/HDL Ratio: 2 ratio units (ref 0.0–4.4)
Cholesterol, Total: 201 mg/dL — ABNORMAL HIGH (ref 100–199)
HDL: 103 mg/dL (ref >39)
LDL Calculated: 80 mg/dL (ref 0–99)
Triglycerides: 90 mg/dL (ref 0–149)
VLDL Cholesterol Cal: 18 mg/dL (ref 5–40)

## 2015-04-05 LAB — TSH: TSH: 1.32 u[IU]/mL (ref 0.450–4.500)

## 2015-04-23 ENCOUNTER — Encounter: Payer: Self-pay | Admitting: Internal Medicine

## 2015-05-13 ENCOUNTER — Encounter (HOSPITAL_COMMUNITY)
Admission: EM | Disposition: A | Discharge status: Home Health | Payer: Self-pay | Source: Home / Self Care | Attending: Internal Medicine

## 2015-05-13 ENCOUNTER — Emergency Department (HOSPITAL_COMMUNITY): Payer: Medicare PPO

## 2015-05-13 ENCOUNTER — Encounter (HOSPITAL_COMMUNITY): Payer: Self-pay

## 2015-05-13 ENCOUNTER — Inpatient Hospital Stay (HOSPITAL_COMMUNITY): Payer: Medicare PPO

## 2015-05-13 ENCOUNTER — Inpatient Hospital Stay (HOSPITAL_COMMUNITY)
Admission: EM | Admit: 2015-05-13 | Discharge: 2015-05-15 | DRG: 481 | Disposition: A | Discharge status: Home Health | Payer: Medicare PPO | Attending: Internal Medicine | Admitting: Internal Medicine

## 2015-05-13 ENCOUNTER — Inpatient Hospital Stay (HOSPITAL_COMMUNITY): Payer: Medicare PPO | Admitting: Certified Registered"

## 2015-05-13 DIAGNOSIS — F0153 Vascular dementia, unspecified severity, with mood disturbance: Secondary | ICD-10-CM | POA: Diagnosis present

## 2015-05-13 DIAGNOSIS — S72141A Displaced intertrochanteric fracture of right femur, initial encounter for closed fracture: Secondary | ICD-10-CM | POA: Diagnosis present

## 2015-05-13 DIAGNOSIS — I10 Essential (primary) hypertension: Secondary | ICD-10-CM

## 2015-05-13 DIAGNOSIS — Z7982 Long term (current) use of aspirin: Secondary | ICD-10-CM

## 2015-05-13 DIAGNOSIS — W19XXXA Unspecified fall, initial encounter: Secondary | ICD-10-CM

## 2015-05-13 DIAGNOSIS — Z79899 Other long term (current) drug therapy: Secondary | ICD-10-CM

## 2015-05-13 DIAGNOSIS — S72001A Fracture of unspecified part of neck of right femur, initial encounter for closed fracture: Secondary | ICD-10-CM

## 2015-05-13 DIAGNOSIS — F17210 Nicotine dependence, cigarettes, uncomplicated: Secondary | ICD-10-CM | POA: Diagnosis present

## 2015-05-13 DIAGNOSIS — E876 Hypokalemia: Secondary | ICD-10-CM | POA: Diagnosis present

## 2015-05-13 DIAGNOSIS — E569 Vitamin deficiency, unspecified: Secondary | ICD-10-CM | POA: Diagnosis present

## 2015-05-13 DIAGNOSIS — G2581 Restless legs syndrome: Secondary | ICD-10-CM | POA: Diagnosis present

## 2015-05-13 DIAGNOSIS — E785 Hyperlipidemia, unspecified: Secondary | ICD-10-CM

## 2015-05-13 DIAGNOSIS — S72009A Fracture of unspecified part of neck of unspecified femur, initial encounter for closed fracture: Secondary | ICD-10-CM | POA: Diagnosis present

## 2015-05-13 DIAGNOSIS — M160 Bilateral primary osteoarthritis of hip: Secondary | ICD-10-CM | POA: Diagnosis present

## 2015-05-13 DIAGNOSIS — F329 Major depressive disorder, single episode, unspecified: Secondary | ICD-10-CM

## 2015-05-13 DIAGNOSIS — F0151 Vascular dementia with behavioral disturbance: Secondary | ICD-10-CM

## 2015-05-13 DIAGNOSIS — N390 Urinary tract infection, site not specified: Secondary | ICD-10-CM

## 2015-05-13 DIAGNOSIS — B9620 Unspecified Escherichia coli [E. coli] as the cause of diseases classified elsewhere: Secondary | ICD-10-CM | POA: Diagnosis present

## 2015-05-13 DIAGNOSIS — J449 Chronic obstructive pulmonary disease, unspecified: Secondary | ICD-10-CM | POA: Diagnosis present

## 2015-05-13 DIAGNOSIS — H409 Unspecified glaucoma: Secondary | ICD-10-CM | POA: Diagnosis present

## 2015-05-13 DIAGNOSIS — F0280 Dementia in other diseases classified elsewhere without behavioral disturbance: Secondary | ICD-10-CM | POA: Diagnosis present

## 2015-05-13 DIAGNOSIS — M810 Age-related osteoporosis without current pathological fracture: Secondary | ICD-10-CM

## 2015-05-13 DIAGNOSIS — G309 Alzheimer's disease, unspecified: Secondary | ICD-10-CM | POA: Diagnosis present

## 2015-05-13 DIAGNOSIS — I739 Peripheral vascular disease, unspecified: Secondary | ICD-10-CM | POA: Diagnosis present

## 2015-05-13 DIAGNOSIS — M25551 Pain in right hip: Secondary | ICD-10-CM | POA: Diagnosis present

## 2015-05-13 DIAGNOSIS — S72001D Fracture of unspecified part of neck of right femur, subsequent encounter for closed fracture with routine healing: Secondary | ICD-10-CM | POA: Diagnosis not present

## 2015-05-13 HISTORY — PX: INTRAMEDULLARY (IM) NAIL INTERTROCHANTERIC: SHX5875

## 2015-05-13 LAB — CBC WITH DIFFERENTIAL/PLATELET
Basophils Absolute: 0 10*3/uL (ref 0.0–0.1)
Basophils Relative: 0 % (ref 0–1)
Eosinophils Absolute: 0.1 10*3/uL (ref 0.0–0.7)
Eosinophils Relative: 1 % (ref 0–5)
HCT: 43.1 % (ref 36.0–46.0)
Hemoglobin: 14.2 g/dL (ref 12.0–15.0)
LYMPHS ABS: 1 10*3/uL (ref 0.7–4.0)
LYMPHS PCT: 14 % (ref 12–46)
MCH: 31.3 pg (ref 26.0–34.0)
MCHC: 32.9 g/dL (ref 30.0–36.0)
MCV: 94.9 fL (ref 78.0–100.0)
MONOS PCT: 5 % (ref 3–12)
Monocytes Absolute: 0.3 10*3/uL (ref 0.1–1.0)
NEUTROS PCT: 80 % — AB (ref 43–77)
Neutro Abs: 5.3 10*3/uL (ref 1.7–7.7)
PLATELETS: 188 10*3/uL (ref 150–400)
RBC: 4.54 MIL/uL (ref 3.87–5.11)
RDW: 13.8 % (ref 11.5–15.5)
WBC: 6.7 10*3/uL (ref 4.0–10.5)

## 2015-05-13 LAB — BASIC METABOLIC PANEL
ANION GAP: 9 (ref 5–15)
BUN: 9 mg/dL (ref 6–20)
CALCIUM: 9.1 mg/dL (ref 8.9–10.3)
CHLORIDE: 104 mmol/L (ref 101–111)
CO2: 29 mmol/L (ref 22–32)
Creatinine, Ser: 0.75 mg/dL (ref 0.44–1.00)
Glucose, Bld: 96 mg/dL (ref 65–99)
Potassium: 3.1 mmol/L — ABNORMAL LOW (ref 3.5–5.1)
Sodium: 142 mmol/L (ref 135–145)

## 2015-05-13 LAB — URINALYSIS, ROUTINE W REFLEX MICROSCOPIC
Bilirubin Urine: NEGATIVE
Glucose, UA: NEGATIVE mg/dL
Ketones, ur: NEGATIVE mg/dL
Nitrite: POSITIVE — AB
PROTEIN: 30 mg/dL — AB
Specific Gravity, Urine: 1.025 (ref 1.005–1.030)
UROBILINOGEN UA: 1 mg/dL (ref 0.0–1.0)
pH: 6 (ref 5.0–8.0)

## 2015-05-13 LAB — TROPONIN I: TROPONIN I: 0.03 ng/mL (ref <0.031)

## 2015-05-13 LAB — SURGICAL PCR SCREEN
MRSA, PCR: NEGATIVE
STAPHYLOCOCCUS AUREUS: NEGATIVE

## 2015-05-13 LAB — URINE MICROSCOPIC-ADD ON

## 2015-05-13 SURGERY — FIXATION, FRACTURE, INTERTROCHANTERIC, WITH INTRAMEDULLARY ROD
Anesthesia: General | Laterality: Right

## 2015-05-13 MED ORDER — ACETAMINOPHEN 650 MG RE SUPP: 650.0000 mg | Freq: Four times a day (QID) | RECTAL | Status: DC | PRN

## 2015-05-13 MED ORDER — CEFAZOLIN SODIUM-DEXTROSE 2-3 GM-% IV SOLR
INTRAVENOUS | Status: DC | PRN
  Administered 2015-05-13: 2 g via INTRAVENOUS

## 2015-05-13 MED ORDER — CEFTRIAXONE SODIUM IN DEXTROSE 20 MG/ML IV SOLN
1.0000 g | INTRAVENOUS | Status: DC
  Administered 2015-05-14: 1 g via INTRAVENOUS
  Filled 2015-05-13 (×3): qty 50

## 2015-05-13 MED ORDER — BUPIVACAINE HCL (PF) 0.5 % IJ SOLN
INTRAMUSCULAR | Status: DC | PRN
  Administered 2015-05-13: 10 mL

## 2015-05-13 MED ORDER — ROSUVASTATIN CALCIUM 10 MG PO TABS
10.0000 mg | ORAL_TABLET | Freq: Every day | ORAL | Status: DC
  Administered 2015-05-14: 10 mg via ORAL
  Filled 2015-05-13: qty 1

## 2015-05-13 MED ORDER — LACTATED RINGERS IV SOLN
INTRAVENOUS | Status: DC
  Administered 2015-05-13 (×2): via INTRAVENOUS

## 2015-05-13 MED ORDER — BRIMONIDINE TARTRATE 0.2 % OP SOLN
1.0000 [drp] | Freq: Two times a day (BID) | OPHTHALMIC | Status: DC
  Administered 2015-05-13 – 2015-05-15 (×5): 1 [drp] via OPHTHALMIC
  Filled 2015-05-13: qty 5

## 2015-05-13 MED ORDER — PROMETHAZINE HCL 25 MG/ML IJ SOLN: 6.2500 mg | INTRAMUSCULAR | Status: DC | PRN

## 2015-05-13 MED ORDER — POLYETHYLENE GLYCOL 3350 17 G PO PACK: 17.0000 g | PACK | Freq: Every day | ORAL | Status: DC | PRN

## 2015-05-13 MED ORDER — DOCUSATE SODIUM 100 MG PO CAPS
100.0000 mg | ORAL_CAPSULE | Freq: Two times a day (BID) | ORAL | Status: DC
  Administered 2015-05-13 – 2015-05-15 (×4): 100 mg via ORAL
  Filled 2015-05-13 (×3): qty 1

## 2015-05-13 MED ORDER — DORZOLAMIDE HCL-TIMOLOL MAL 2-0.5 % OP SOLN
1.0000 [drp] | Freq: Two times a day (BID) | OPHTHALMIC | Status: DC
  Administered 2015-05-13 – 2015-05-15 (×5): 1 [drp] via OPHTHALMIC
  Filled 2015-05-13: qty 10

## 2015-05-13 MED ORDER — HYDRALAZINE HCL 20 MG/ML IJ SOLN: 10.0000 mg | Freq: Four times a day (QID) | INTRAMUSCULAR | Status: DC | PRN

## 2015-05-13 MED ORDER — FENTANYL CITRATE (PF) 100 MCG/2ML IJ SOLN
INTRAMUSCULAR | Status: DC | PRN
  Administered 2015-05-13: 50 ug via INTRAVENOUS

## 2015-05-13 MED ORDER — POTASSIUM CHLORIDE 10 MEQ/100ML IV SOLN
10.0000 meq | INTRAVENOUS | Status: AC
  Administered 2015-05-13: 10 meq via INTRAVENOUS
  Filled 2015-05-13 (×2): qty 100

## 2015-05-13 MED ORDER — MEMANTINE HCL ER 28 MG PO CP24
28.0000 mg | ORAL_CAPSULE | Freq: Every day | ORAL | Status: DC
  Administered 2015-05-14 – 2015-05-15 (×2): 28 mg via ORAL
  Filled 2015-05-13 (×3): qty 1

## 2015-05-13 MED ORDER — PROPOFOL 10 MG/ML IV BOLUS
INTRAVENOUS | Status: AC
  Filled 2015-05-13: qty 20

## 2015-05-13 MED ORDER — WHITE PETROLATUM GEL
Status: AC
  Administered 2015-05-13: 0.2
  Filled 2015-05-13: qty 1

## 2015-05-13 MED ORDER — MENTHOL 3 MG MT LOZG: 1.0000 | LOZENGE | OROMUCOSAL | Status: DC | PRN

## 2015-05-13 MED ORDER — ENSURE ENLIVE PO LIQD
237.0000 mL | Freq: Two times a day (BID) | ORAL | Status: DC
  Administered 2015-05-14 – 2015-05-15 (×4): 237 mL via ORAL

## 2015-05-13 MED ORDER — SUCCINYLCHOLINE CHLORIDE 20 MG/ML IJ SOLN
INTRAMUSCULAR | Status: DC | PRN
  Administered 2015-05-13: 80 mg via INTRAVENOUS

## 2015-05-13 MED ORDER — SODIUM CHLORIDE 0.9 % IV SOLN
INTRAVENOUS | Status: DC
Start: 2015-05-13 — End: 2015-05-14
  Administered 2015-05-13: 15:00:00 via INTRAVENOUS

## 2015-05-13 MED ORDER — FENTANYL CITRATE (PF) 100 MCG/2ML IJ SOLN
100.0000 ug | Freq: Once | INTRAMUSCULAR | Status: AC
  Administered 2015-05-13: 100 ug via INTRAVENOUS
  Filled 2015-05-13: qty 2

## 2015-05-13 MED ORDER — SODIUM CHLORIDE 0.9 % IV SOLN
10.0000 mg | INTRAVENOUS | Status: DC | PRN
  Administered 2015-05-13: 25 ug/min via INTRAVENOUS

## 2015-05-13 MED ORDER — ASPIRIN 81 MG PO CHEW: 81.0000 mg | CHEWABLE_TABLET | Freq: Every day | ORAL | Status: DC

## 2015-05-13 MED ORDER — DONEPEZIL HCL 10 MG PO TABS
10.0000 mg | ORAL_TABLET | Freq: Every day | ORAL | Status: DC
  Administered 2015-05-13 – 2015-05-14 (×2): 10 mg via ORAL
  Filled 2015-05-13 (×2): qty 1

## 2015-05-13 MED ORDER — BUPIVACAINE-EPINEPHRINE (PF) 0.5% -1:200000 IJ SOLN
INTRAMUSCULAR | Status: AC
  Filled 2015-05-13: qty 30

## 2015-05-13 MED ORDER — ONDANSETRON HCL 4 MG/2ML IJ SOLN: 4.0000 mg | Freq: Four times a day (QID) | INTRAMUSCULAR | Status: DC | PRN

## 2015-05-13 MED ORDER — PROPOFOL 10 MG/ML IV BOLUS
INTRAVENOUS | Status: DC | PRN
  Administered 2015-05-13: 60 mg via INTRAVENOUS

## 2015-05-13 MED ORDER — BISACODYL 10 MG RE SUPP: 10.0000 mg | Freq: Every day | RECTAL | Status: DC | PRN

## 2015-05-13 MED ORDER — FENTANYL CITRATE (PF) 250 MCG/5ML IJ SOLN
INTRAMUSCULAR | Status: AC
  Filled 2015-05-13: qty 5

## 2015-05-13 MED ORDER — HYDROMORPHONE HCL 1 MG/ML IJ SOLN: 0.2500 mg | INTRAMUSCULAR | Status: DC | PRN

## 2015-05-13 MED ORDER — ONDANSETRON HCL 4 MG/2ML IJ SOLN
INTRAMUSCULAR | Status: DC | PRN
  Administered 2015-05-13: 4 mg via INTRAVENOUS

## 2015-05-13 MED ORDER — ARTIFICIAL TEARS OP OINT
TOPICAL_OINTMENT | OPHTHALMIC | Status: DC | PRN
  Administered 2015-05-13: 1 via OPHTHALMIC

## 2015-05-13 MED ORDER — METOCLOPRAMIDE HCL 5 MG/ML IJ SOLN: 5.0000 mg | Freq: Three times a day (TID) | INTRAMUSCULAR | Status: DC | PRN

## 2015-05-13 MED ORDER — ONDANSETRON HCL 4 MG PO TABS: 4.0000 mg | ORAL_TABLET | Freq: Four times a day (QID) | ORAL | Status: DC | PRN

## 2015-05-13 MED ORDER — LIDOCAINE HCL (CARDIAC) 20 MG/ML IV SOLN
INTRAVENOUS | Status: DC | PRN
  Administered 2015-05-13: 80 mg via INTRAVENOUS

## 2015-05-13 MED ORDER — MORPHINE SULFATE 4 MG/ML IJ SOLN: 4.0000 mg | Freq: Once | INTRAMUSCULAR | Status: DC

## 2015-05-13 MED ORDER — PHENOL 1.4 % MT LIQD: 1.0000 | OROMUCOSAL | Status: DC | PRN

## 2015-05-13 MED ORDER — ASPIRIN EC 325 MG PO TBEC
325.0000 mg | DELAYED_RELEASE_TABLET | Freq: Every day | ORAL | Status: DC
Start: 2015-05-14 — End: 2015-05-15
  Administered 2015-05-14 – 2015-05-15 (×2): 325 mg via ORAL
  Filled 2015-05-13 (×2): qty 1

## 2015-05-13 MED ORDER — ONDANSETRON HCL 4 MG/2ML IJ SOLN: 4.0000 mg | Freq: Once | INTRAMUSCULAR | Status: DC

## 2015-05-13 MED ORDER — DEXTROSE 5 % IV SOLN
1.0000 g | Freq: Once | INTRAVENOUS | Status: AC
  Administered 2015-05-13: 1 g via INTRAVENOUS
  Filled 2015-05-13: qty 10

## 2015-05-13 MED ORDER — HYDRALAZINE HCL 20 MG/ML IJ SOLN
10.0000 mg | Freq: Once | INTRAMUSCULAR | Status: AC
  Administered 2015-05-13: 10 mg via INTRAVENOUS
  Filled 2015-05-13: qty 1

## 2015-05-13 MED ORDER — HYDROCODONE-ACETAMINOPHEN 5-325 MG PO TABS
1.0000 | ORAL_TABLET | Freq: Four times a day (QID) | ORAL | Status: DC | PRN
  Administered 2015-05-13 – 2015-05-14 (×2): 1 via ORAL
  Filled 2015-05-13 (×2): qty 1

## 2015-05-13 MED ORDER — LATANOPROST 0.005 % OP SOLN
1.0000 [drp] | Freq: Every day | OPHTHALMIC | Status: DC
  Administered 2015-05-13 – 2015-05-14 (×2): 1 [drp] via OPHTHALMIC
  Filled 2015-05-13: qty 2.5

## 2015-05-13 MED ORDER — EPHEDRINE SULFATE 50 MG/ML IJ SOLN
INTRAMUSCULAR | Status: DC | PRN
  Administered 2015-05-13: 10 mg via INTRAVENOUS

## 2015-05-13 MED ORDER — VITAMIN D 1000 UNITS PO TABS
1000.0000 [IU] | ORAL_TABLET | Freq: Every day | ORAL | Status: DC
  Administered 2015-05-14 – 2015-05-15 (×2): 1000 [IU] via ORAL
  Filled 2015-05-13 (×2): qty 1

## 2015-05-13 MED ORDER — GLYCOPYRROLATE 0.2 MG/ML IJ SOLN
INTRAMUSCULAR | Status: DC | PRN
  Administered 2015-05-13: 0.2 mg via INTRAVENOUS

## 2015-05-13 MED ORDER — HYDROMORPHONE HCL 1 MG/ML IJ SOLN
0.2500 mg | INTRAMUSCULAR | Status: DC | PRN
  Administered 2015-05-13 – 2015-05-14 (×2): 0.25 mg via INTRAVENOUS
  Filled 2015-05-13 (×2): qty 1

## 2015-05-13 MED ORDER — ENSURE PO LIQD: 237.0000 mL | Freq: Two times a day (BID) | ORAL | Status: DC

## 2015-05-13 MED ORDER — CEFAZOLIN SODIUM 1-5 GM-% IV SOLN
1.0000 g | Freq: Once | INTRAVENOUS | Status: AC
  Administered 2015-05-14: 1 g via INTRAVENOUS
  Filled 2015-05-13: qty 50

## 2015-05-13 MED ORDER — BRIMONIDINE TARTRATE 0.15 % OP SOLN
1.0000 [drp] | Freq: Two times a day (BID) | OPHTHALMIC | Status: DC
  Filled 2015-05-13: qty 5

## 2015-05-13 MED ORDER — ACETAMINOPHEN 325 MG PO TABS
650.0000 mg | ORAL_TABLET | Freq: Four times a day (QID) | ORAL | Status: DC | PRN
  Administered 2015-05-14 – 2015-05-15 (×2): 650 mg via ORAL
  Filled 2015-05-13 (×2): qty 2

## 2015-05-13 MED ORDER — MORPHINE SULFATE 2 MG/ML IJ SOLN: 0.5000 mg | INTRAMUSCULAR | Status: DC | PRN

## 2015-05-13 MED ORDER — METOCLOPRAMIDE HCL 5 MG PO TABS: 5.0000 mg | ORAL_TABLET | Freq: Three times a day (TID) | ORAL | Status: DC | PRN

## 2015-05-13 SURGICAL SUPPLY — 49 items
BIT DRILL CANN LG 4.3MM (BIT) ×1 IMPLANT
BNDG COHESIVE 4X5 TAN STRL (GAUZE/BANDAGES/DRESSINGS) ×3 IMPLANT
CANISTER SUCTION 2500CC (MISCELLANEOUS) ×3 IMPLANT
CATH ROBINSON RED A/P 14FR (CATHETERS) ×3 IMPLANT
COVER MAYO STAND STRL (DRAPES) ×3 IMPLANT
COVER PERINEAL POST (MISCELLANEOUS) ×3 IMPLANT
COVER SURGICAL LIGHT HANDLE (MISCELLANEOUS) ×3 IMPLANT
DRAPE C-ARM 42X72 X-RAY (DRAPES) ×3 IMPLANT
DRAPE STERI IOBAN 125X83 (DRAPES) ×3 IMPLANT
DRILL BIT CANN LG 4.3MM (BIT) ×3
DRSG MEPILEX BORDER 4X12 (GAUZE/BANDAGES/DRESSINGS) ×3 IMPLANT
DRSG MEPILEX BORDER 4X8 (GAUZE/BANDAGES/DRESSINGS) ×3 IMPLANT
DRSG PAD ABDOMINAL 8X10 ST (GAUZE/BANDAGES/DRESSINGS) ×3 IMPLANT
DURAPREP 26ML APPLICATOR (WOUND CARE) ×3 IMPLANT
ELECT CAUTERY BLADE 6.4 (BLADE) ×3 IMPLANT
ELECT REM PT RETURN 9FT ADLT (ELECTROSURGICAL) ×3
ELECTRODE REM PT RTRN 9FT ADLT (ELECTROSURGICAL) ×1 IMPLANT
EVACUATOR 1/8 PVC DRAIN (DRAIN) IMPLANT
GAUZE SPONGE 4X4 12PLY STRL (GAUZE/BANDAGES/DRESSINGS) ×3 IMPLANT
GAUZE XEROFORM 1X8 LF (GAUZE/BANDAGES/DRESSINGS) ×3 IMPLANT
GLOVE BIO SURGEON STRL SZ7 (GLOVE) ×6 IMPLANT
GLOVE BIO SURGEON STRL SZ7.5 (GLOVE) ×3 IMPLANT
GLOVE BIOGEL PI IND STRL 7.5 (GLOVE) ×1 IMPLANT
GLOVE BIOGEL PI IND STRL 8 (GLOVE) ×3 IMPLANT
GLOVE BIOGEL PI INDICATOR 7.5 (GLOVE) ×2
GLOVE BIOGEL PI INDICATOR 8 (GLOVE) ×6
GLOVE ORTHO TXT STRL SZ7.5 (GLOVE) ×6 IMPLANT
GOWN STRL REUS W/ TWL LRG LVL3 (GOWN DISPOSABLE) ×1 IMPLANT
GOWN STRL REUS W/TWL 2XL LVL3 (GOWN DISPOSABLE) ×3 IMPLANT
GOWN STRL REUS W/TWL LRG LVL3 (GOWN DISPOSABLE) ×5 IMPLANT
GUIDEPIN 3.2X17.5 THRD DISP (PIN) ×3 IMPLANT
KIT BASIN OR (CUSTOM PROCEDURE TRAY) ×3 IMPLANT
KIT ROOM TURNOVER OR (KITS) ×3 IMPLANT
LINER BOOT UNIVERSAL DISP (MISCELLANEOUS) ×3 IMPLANT
MANIFOLD NEPTUNE II (INSTRUMENTS) ×3 IMPLANT
NAIL HIP FRACT 130D 11X180 (Screw) ×3 IMPLANT
NS IRRIG 1000ML POUR BTL (IV SOLUTION) ×3 IMPLANT
PACK GENERAL/GYN (CUSTOM PROCEDURE TRAY) ×3 IMPLANT
PAD ARMBOARD 7.5X6 YLW CONV (MISCELLANEOUS) ×6 IMPLANT
PENCIL BUTTON HOLSTER BLD 10FT (ELECTRODE) ×3 IMPLANT
SCREW BONE CORTICAL 5.0X36 (Screw) ×3 IMPLANT
SCREW LAG HIP NAIL 10.5X95 (Screw) ×3 IMPLANT
SOLUTION BETADINE 4OZ (MISCELLANEOUS) ×3 IMPLANT
STAPLER VISISTAT 35W (STAPLE) ×3 IMPLANT
SUT VIC AB 0 CT1 27 (SUTURE) ×2
SUT VIC AB 0 CT1 27XBRD ANBCTR (SUTURE) ×1 IMPLANT
SUT VIC AB 2-0 CT1 27 (SUTURE) ×4
SUT VIC AB 2-0 CT1 TAPERPNT 27 (SUTURE) ×2 IMPLANT
WATER STERILE IRR 1000ML POUR (IV SOLUTION) ×3 IMPLANT

## 2015-05-13 NOTE — ED Provider Notes (Signed)
CSN: [id number]     Arrival date & time 05/13/15  0815 History   First MD Initiated Contact with Patient 05/13/15 0815     Chief Complaint  Patient presents with  . Fall     (Consider location/radiation/quality/duration/timing/severity/associated sxs/prior Treatment) HPI Comments: Ems reports that pt had a fall last night and then again this morning. Pt doesn't remember anything. Nursing homes states that pt ambulates with a walker at baseline  The history is provided by the EMS personnel. No language interpreter was used.    Past Medical History  Diagnosis Date  . Peripheral vascular disease, unspecified   . Unspecified glaucoma   . Vascular dementia with depressed mood   . Loss of weight   . Vitamin deficiency   . Hyperlipidemia   . Restless legs syndrome (RLS)   . Occlusion and stenosis of carotid artery without mention of cerebral infarction   . Osteoporosis    Past Surgical History  Procedure Laterality Date  . Appendectomy    . Carotid endarterectomy  07/14/1968  . Colonoscopy  2008    Normal    No family history on file. History  Substance Use Topics  . Smoking status: Current Every Day Smoker -- 2.00 packs/day for 50 years    Types: Cigarettes  . Smokeless tobacco: Not on file  . Alcohol Use: No   OB History    No data available     Review of Systems  Unable to perform ROS: Dementia  All other systems reviewed and are negative.     Allergies  Review of patient's allergies indicates no known allergies.  Home Medications   Prior to Admission medications   Medication Sig Start Date End Date Taking? Authorizing Provider  aspirin 81 MG tablet Take 81 mg by mouth daily.    Historical Provider, MD  bimatoprost (LUMIGAN) 0.03 % ophthalmic solution 1 drop at bedtime.    Historical Provider, MD  brimonidine (ALPHAGAN) 0.15 % ophthalmic solution 1 drop 2 (two) times daily.    Historical Provider, MD  cholecalciferol (VITAMIN D) 1000 UNITS tablet Take 1,000  Units by mouth daily.    Historical Provider, MD  CRESTOR 10 MG tablet TAKE 1 TABLET EVERY DAY FOR CHOLESTEROL    Tiffany L Reed, DO  donepezil (ARICEPT) 10 MG tablet Take one tablet every day for memory  04/16/14   Tiffany L Reed, DO  dorzolamide-timolol (COSOPT) 22.3-6.8 MG/ML ophthalmic solution 1 drop 2 (two) times daily.    Historical Provider, MD  Memantine HCl ER (NAMENDA XR TITRATION PACK) 7 & 14 & 21 &28 MG CP24 Use as directed 05/17/14   Tiffany L Reed, DO  Memantine HCl ER (NAMENDA XR) 28 MG CP24 Take 28 mg by mouth daily. 05/17/14   Tiffany L Reed, DO  mirtazapine (REMERON) 15 MG tablet Take 1 tablet (15 mg total) by mouth at bedtime. 05/17/14   Tiffany L Reed, DO   BP 205/60 mmHg  Pulse 56  Temp(Src) 98 F (36.7 C) (Oral)  Resp 19  SpO2 95% Physical Exam  Constitutional: She is oriented to person, place, and time. She appears well-developed and well-nourished.  HENT:  Head: Normocephalic and atraumatic.  Cardiovascular: Normal rate and regular rhythm.   Pulmonary/Chest: Effort normal and breath sounds normal.  Abdominal: Soft. Bowel sounds are normal. There is no tenderness.  Musculoskeletal:       Right hip: She exhibits tenderness.       Cervical back: She exhibits bony tenderness.  Thoracic back: Normal.       Lumbar back: Normal.  Externally rotated right leg. Bruising to the right leg. Unable to flex  Neurological: She is alert and oriented to person, place, and time.  Skin:  Abrasion to bilateral forearm  Nursing note and vitals reviewed.   ED Course  Procedures (including critical care time) Labs Review Labs Reviewed  URINALYSIS, ROUTINE W REFLEX MICROSCOPIC (NOT AT [facility]) - Abnormal; Notable for the following:    APPearance CLOUDY (*)    Hgb urine dipstick TRACE (*)    Protein, ur 30 (*)    Nitrite POSITIVE (*)    Leukocytes, UA SMALL (*)    All other components within normal limits  BASIC METABOLIC PANEL - Abnormal; Notable for the following:     Potassium 3.1 (*)    All other components within normal limits  CBC WITH DIFFERENTIAL/PLATELET - Abnormal; Notable for the following:    Neutrophils Relative % 80 (*)    All other components within normal limits  URINE MICROSCOPIC-ADD ON - Abnormal; Notable for the following:    Bacteria, UA MANY (*)    All other components within normal limits  URINE CULTURE  TROPONIN I    Imaging Review Dg Chest 1 View  05/13/2015   CLINICAL DATA:  [address].  Confusion.  EXAM: CHEST  1 VIEW  COMPARISON:  04/15/2010.  FINDINGS: Heart size is normal. There is calcification of the thoracic aorta. There is a hiatal hernia. No evidence of pneumothorax or hemothorax. There are some areas of indistinct density in both lungs that could represent scarring. Nodules are not excluded. Two-view chest radiography suggested when able. No acute bone finding.  IMPRESSION: No acute or traumatic finding.  Hiatal hernia.  Indistinct pulmonary densities that could represent scarring. Two-view chest radiography recommended when able to assess for the possibility of nodules.   Electronically Signed   By: Nelson Chimes M.D.   On: 05/13/2015 10:03   Ct Head Wo Contrast  05/13/2015   CLINICAL DATA:  Fall 2 times starting Saturday, right knee pain  EXAM: CT HEAD WITHOUT CONTRAST  TECHNIQUE: Contiguous axial images were obtained from the base of the skull through the vertex without intravenous contrast.  COMPARISON:  04/21/2010  FINDINGS: There is 7 mm high-density nodule in the roof of left maxillary sinus.  Atherosclerotic calcifications of carotid siphon. No intracranial hemorrhage, mass effect or midline shift. Small lacunar infarct is noted in right basal ganglia. Moderate cerebral atrophy. Periventricular and patchy subcortical white matter decreased attenuation probable due to chronic small vessel ischemic changes. No definite acute cortical infarction. No mass lesion is noted on this unenhanced scan.  IMPRESSION: 1. No acute intracranial  abnormality. Moderate cerebral atrophy. Periventricular and patchy subcortical white matter decreased attenuation probable due to chronic small vessel ischemic changes. There is probable partially calcified 7 mm nodule in the roof of left maxillary sinus. 2. No definite acute cortical infarction. Small lacunar infarct in right basal ganglia.   Electronically Signed   By: Lahoma Crocker M.D.   On: 05/13/2015 09:30   Ct Cervical Spine Wo Contrast  05/13/2015   CLINICAL DATA:  Multiple recent falls. The patient denies neck pain.  EXAM: CT CERVICAL SPINE WITHOUT CONTRAST  TECHNIQUE: Multidetector CT imaging of the cervical spine was performed without intravenous contrast. Multiplanar CT image reconstructions were also generated.  COMPARISON:  CT head without contrast from the same day.  FINDINGS: Mild osteopenia is present. Leftward curvature of the cervical spine  is present. Vertebral body heights are maintained. Slight degenerative anterolisthesis is present at C3-4.  The soft tissues the neck demonstrate atherosclerotic changes of the carotid arteries bilaterally denser calcifications are present on the left. The thyroid is somewhat heterogeneous. The dominant right posterior lesion measures at least 1.6 cm. Calcifications are present in the left lobe. The lung apices are clear.  Degenerative changes are present at C1-2 without significant stenosis.  C2-3: The facet joints are fused bilaterally. There is no significant stenosis.  C3-4: Asymmetric right-sided facet hypertrophy and uncovertebral disease results in moderate right foraminal stenosis. The central canal and left foramen are patent.  C4-5: Uncovertebral and facet disease leads to moderate left and mild right foraminal stenosis.  C5-6:  Asymmetric mild left foraminal narrowing at C5-6.  C6-7: Uncovertebral spurring leads to moderate left foraminal stenosis.  C7-T1:  Negative.  IMPRESSION: 1. Multilevel spondylosis of the cervical spine with mild leftward  curvature. 2. No acute abnormality. 3. Atherosclerotic changes of the carotid bifurcations appear worse on the left.   Electronically Signed   By: San Morelle M.D.   On: 05/13/2015 11:27   Ct Hip Right Wo Contrast  05/13/2015   CLINICAL DATA:  Status post fall.  Right hip pain.  EXAM: CT OF THE RIGHT HIP WITHOUT CONTRAST  TECHNIQUE: Multidetector CT imaging of the right hip was performed according to the standard protocol. Multiplanar CT image reconstructions were also generated.  COMPARISON:  None.  FINDINGS: There is severe osteopenia. There is a comminuted right intertrochanteric fracture. There is no dislocation. There is severe osteoarthritis of the right hip with a bone-on-bone appearance and subchondral cystic changes. There are bulky marginal osteophytes.  There is a 2.5 cm hyperdense area in the subcutaneous fat overlying the right greater trochanter consistent with a hematoma. There is no other fluid collection or scratch at there is no focal fluid collection.  There is rib rectal fecal impaction.  There is peripheral vascular atherosclerotic disease.  IMPRESSION: 1. Comminuted right intertrochanteric fracture without dislocation. 2. Severe osteoarthritis of the right hip.   Electronically Signed   By: Kathreen Devoid   On: 05/13/2015 11:41   Dg Knee Complete 4 Views Right  05/13/2015   CLINICAL DATA:  Confusion.  Fell.  Swelling and pain.  EXAM: RIGHT KNEE - COMPLETE 4+ VIEW  COMPARISON:  None.  FINDINGS: The bones are osteopenic. No joint effusion. There is chronic calcification in the distal quadriceps tendon and possibly in the suprapatellar recess of the joint. No sign of acute fracture of the patella or extensor mechanism. There is chronic chondrocalcinosis and chronic vascular calcification. No acute bony injury.  IMPRESSION: No acute injury seen. Chronic appearing calcification in the quadriceps/patellar extensor mechanism region. No suspicion of acute fracture. No joint effusion. There  could be small intra-articular loose bodies in the suprapatellar recess.   Electronically Signed   By: Nelson Chimes M.D.   On: 05/13/2015 10:01   Dg Hip Unilat With Pelvis 2-3 Views Right  05/13/2015   CLINICAL DATA:  Recent fall with right hip pain, initial encounter  EXAM: RIGHT HIP (WITH PELVIS) 2-3 VIEWS  COMPARISON:  None.  FINDINGS: The pelvic ring is intact. Severe degenerative changes of the hip joints are noted bilaterally. There is lucency identified in the region of greater trochanter and extending inferiorly which is highly suspicious for undisplaced fracture. Cross-sectional imaging may be helpful for further evaluation. Diffuse aortic calcifications are again identified and stable.  IMPRESSION: Changes suspicious for intratrochanteric  right femoral fracture.  Aortic calcifications with mild aneurysmal dilatation stable from the prior exam.   Electronically Signed   By: Inez Catalina M.D.   On: 05/13/2015 10:00     EKG Interpretation   Date/Time:  Monday May 13 2015 08:17:11 EDT Ventricular Rate:  48 PR Interval:  141 QRS Duration: 89 QT Interval:  427 QTC Calculation: 381 R Axis:   -30 Text Interpretation:  Sinus bradycardia Left ventricular hypertrophy  Anterior Q waves, possibly due to LVH Nonspecific T abnormalities, lateral  leads No previous tracing Confirmed by POLLINA  MD, CHRISTOPHER [id number]) on  05/13/2015 8:20:14 AM      MDM   Final diagnoses:  Fall  UTI (lower urinary tract infection)  Intertrochanteric fracture of right hip, closed, initial encounter    Pt to be admitted to the hospital by the hospitalist. Pt started on rocephin for uti. Spoke with Dr. Lorin Mercy with ortho and he will consult on the pt.    Glendell Docker, NP 05/13/15 [place], MD 05/13/15 1257

## 2015-05-13 NOTE — Progress Notes (Signed)
Report given Maudie Mercury, RN

## 2015-05-13 NOTE — Anesthesia Preprocedure Evaluation (Addendum)
Anesthesia Evaluation  Patient identified by MRN, date of birth, ID band Patient awake    Reviewed: Allergy & Precautions, NPO status , Patient's Chart, lab work & pertinent test results  Airway Mallampati: II  TM Distance: >3 FB Neck ROM: Full    Dental no notable dental hx.    Pulmonary Current Smoker,  breath sounds clear to auscultation  Pulmonary exam normal       Cardiovascular hypertension, + Peripheral Vascular Disease Normal cardiovascular examRhythm:Regular Rate:Normal     Neuro/Psych Severe dementia negative psych ROS   GI/Hepatic negative GI ROS, Neg liver ROS,   Endo/Other  negative endocrine ROS  Renal/GU negative Renal ROS  negative genitourinary   Musculoskeletal negative musculoskeletal ROS (+)   Abdominal   Peds negative pediatric ROS (+)  Hematology negative hematology ROS (+)   Anesthesia Other Findings   Reproductive/Obstetrics negative OB ROS                            Anesthesia Physical Anesthesia Plan  ASA: III  Anesthesia Plan: General   Post-op Pain Management:    Induction: Intravenous  Airway Management Planned: Oral ETT  Additional Equipment:   Intra-op Plan:   Post-operative Plan: Extubation in OR  Informed Consent: I have reviewed the patients History and Physical, chart, labs and discussed the procedure including the risks, benefits and alternatives for the proposed anesthesia with the patient or authorized representative who has indicated his/her understanding and acceptance.   Dental advisory given  Plan Discussed with: CRNA and Surgeon  Anesthesia Plan Comments:         Anesthesia Quick Evaluation

## 2015-05-13 NOTE — ED Notes (Signed)
Pt refusing to let NT or RN place leads on for portable monitoring. RN explained to pt why we needed to place pt on portable monitor, NT then attempted to hook pt back up, pt then became combative stating "get away from me, I dont need those just leave me alone"

## 2015-05-13 NOTE — H&P (View-Only) (Signed)
Patient ID: Brittney Graham Height MRN: [id number] DOB/AGE: 1924-09-04 81 y.o.  Admit date: 05/13/2015  Admission Diagnoses:  Principal Problem:   Hip fracture, right Active Problems:   Vascular dementia with depressed mood   Hyperlipidemia   Osteoporosis   UTI (lower urinary tract infection)   Accelerated hypertension   Hip fracture   HPI: Elderly patient with dementia.  Son present to give history.  Fell today.  xrays in ED showed a nondisplaced right IT hip fracture.  [address] in assisted living , past Hx of poor ambulation with hip OA. No C/O left hip pain. She has pain when she moves her right hip.oriented times one . Poor historian.   Past Medical History: Past Medical History  Diagnosis Date  . Peripheral vascular disease, unspecified   . Unspecified glaucoma   . Vascular dementia with depressed mood   . Loss of weight   . Vitamin deficiency   . Hyperlipidemia   . Restless legs syndrome (RLS)   . Occlusion and stenosis of carotid artery without mention of cerebral infarction   . Osteoporosis     Surgical History: Past Surgical History  Procedure Laterality Date  . Appendectomy    . Carotid endarterectomy  07/14/1968  . Colonoscopy  2008    Normal     Family History: No family history on file.  Social History: History   Social History  . Marital Status: Widowed    Spouse Name: N/A  . Number of Children: N/A  . Years of Education: N/A   Occupational History  . Not on file.   Social History Main Topics  . Smoking status: Current Every Day Smoker -- 2.00 packs/day for 50 years    Types: Cigarettes  . Smokeless tobacco: Not on file  . Alcohol Use: No  . Drug Use: No  . Sexual Activity: Not on file   Other Topics Concern  . Not on file   Social History Narrative    Allergies: Review of patient's allergies indicates no known allergies.  Medications: I have reviewed the patient's current medications.  Vital Signs: Patient Vitals for the past  24 hrs:  BP Temp Temp src Pulse Resp SpO2  05/13/15 1315 (!) 150/55 mmHg 98.1 F (36.7 C) Oral 78 18 98 %  05/13/15 1230 167/62 mmHg - - 65 - 98 %  05/13/15 1200 182/88 mmHg - - (!) 59 - 96 %  05/13/15 1143 181/63 mmHg - - 67 22 100 %  05/13/15 1112 - - - 65 (!) 35 98 %  05/13/15 1109 200/87 mmHg - - - - -  05/13/15 1022 (!) 207/74 mmHg - - (!) 53 15 97 %  05/13/15 0950 - - - - 14 -  05/13/15 0949 - - - (!) 56 - 94 %  05/13/15 0948 197/69 mmHg - - - - -  05/13/15 0845 200/60 mmHg - - (!) 50 13 96 %  05/13/15 0830 (!) 224/80 mmHg - - (!) 49 15 96 %  05/13/15 0817 (!) 205/60 mmHg 98 F (36.7 C) Oral (!) 56 19 95 %    Radiology: Dg Chest 1 View  05/13/2015   CLINICAL DATA:  [address].  Confusion.  EXAM: CHEST  1 VIEW  COMPARISON:  04/15/2010.  FINDINGS: Heart size is normal. There is calcification of the thoracic aorta. There is a hiatal hernia. No evidence of pneumothorax or hemothorax. There are some areas of indistinct density in both lungs that could represent scarring. Nodules are not excluded.  Two-view chest radiography suggested when able. No acute bone finding.  IMPRESSION: No acute or traumatic finding.  Hiatal hernia.  Indistinct pulmonary densities that could represent scarring. Two-view chest radiography recommended when able to assess for the possibility of nodules.   Electronically Signed   By: Nelson Chimes M.D.   On: 05/13/2015 10:03   Ct Head Wo Contrast  05/13/2015   CLINICAL DATA:  Fall 2 times starting Saturday, right knee pain  EXAM: CT HEAD WITHOUT CONTRAST  TECHNIQUE: Contiguous axial images were obtained from the base of the skull through the vertex without intravenous contrast.  COMPARISON:  04/21/2010  FINDINGS: There is 7 mm high-density nodule in the roof of left maxillary sinus.  Atherosclerotic calcifications of carotid siphon. No intracranial hemorrhage, mass effect or midline shift. Small lacunar infarct is noted in right basal ganglia. Moderate cerebral atrophy.  Periventricular and patchy subcortical white matter decreased attenuation probable due to chronic small vessel ischemic changes. No definite acute cortical infarction. No mass lesion is noted on this unenhanced scan.  IMPRESSION: 1. No acute intracranial abnormality. Moderate cerebral atrophy. Periventricular and patchy subcortical white matter decreased attenuation probable due to chronic small vessel ischemic changes. There is probable partially calcified 7 mm nodule in the roof of left maxillary sinus. 2. No definite acute cortical infarction. Small lacunar infarct in right basal ganglia.   Electronically Signed   By: Lahoma Crocker M.D.   On: 05/13/2015 09:30   Ct Cervical Spine Wo Contrast  05/13/2015   CLINICAL DATA:  Multiple recent falls. The patient denies neck pain.  EXAM: CT CERVICAL SPINE WITHOUT CONTRAST  TECHNIQUE: Multidetector CT imaging of the cervical spine was performed without intravenous contrast. Multiplanar CT image reconstructions were also generated.  COMPARISON:  CT head without contrast from the same day.  FINDINGS: Mild osteopenia is present. Leftward curvature of the cervical spine is present. Vertebral body heights are maintained. Slight degenerative anterolisthesis is present at C3-4.  The soft tissues the neck demonstrate atherosclerotic changes of the carotid arteries bilaterally denser calcifications are present on the left. The thyroid is somewhat heterogeneous. The dominant right posterior lesion measures at least 1.6 cm. Calcifications are present in the left lobe. The lung apices are clear.  Degenerative changes are present at C1-2 without significant stenosis.  C2-3: The facet joints are fused bilaterally. There is no significant stenosis.  C3-4: Asymmetric right-sided facet hypertrophy and uncovertebral disease results in moderate right foraminal stenosis. The central canal and left foramen are patent.  C4-5: Uncovertebral and facet disease leads to moderate left and mild right  foraminal stenosis.  C5-6:  Asymmetric mild left foraminal narrowing at C5-6.  C6-7: Uncovertebral spurring leads to moderate left foraminal stenosis.  C7-T1:  Negative.  IMPRESSION: 1. Multilevel spondylosis of the cervical spine with mild leftward curvature. 2. No acute abnormality. 3. Atherosclerotic changes of the carotid bifurcations appear worse on the left.   Electronically Signed   By: San Morelle M.D.   On: 05/13/2015 11:27   Ct Hip Right Wo Contrast  05/13/2015   CLINICAL DATA:  Status post fall.  Right hip pain.  EXAM: CT OF THE RIGHT HIP WITHOUT CONTRAST  TECHNIQUE: Multidetector CT imaging of the right hip was performed according to the standard protocol. Multiplanar CT image reconstructions were also generated.  COMPARISON:  None.  FINDINGS: There is severe osteopenia. There is a comminuted right intertrochanteric fracture. There is no dislocation. There is severe osteoarthritis of the right hip with a bone-on-bone  appearance and subchondral cystic changes. There are bulky marginal osteophytes.  There is a 2.5 cm hyperdense area in the subcutaneous fat overlying the right greater trochanter consistent with a hematoma. There is no other fluid collection or scratch at there is no focal fluid collection.  There is rib rectal fecal impaction.  There is peripheral vascular atherosclerotic disease.  IMPRESSION: 1. Comminuted right intertrochanteric fracture without dislocation. 2. Severe osteoarthritis of the right hip.   Electronically Signed   By: Kathreen Devoid   On: 05/13/2015 11:41   Dg Knee Complete 4 Views Right  05/13/2015   CLINICAL DATA:  Confusion.  Fell.  Swelling and pain.  EXAM: RIGHT KNEE - COMPLETE 4+ VIEW  COMPARISON:  None.  FINDINGS: The bones are osteopenic. No joint effusion. There is chronic calcification in the distal quadriceps tendon and possibly in the suprapatellar recess of the joint. No sign of acute fracture of the patella or extensor mechanism. There is chronic  chondrocalcinosis and chronic vascular calcification. No acute bony injury.  IMPRESSION: No acute injury seen. Chronic appearing calcification in the quadriceps/patellar extensor mechanism region. No suspicion of acute fracture. No joint effusion. There could be small intra-articular loose bodies in the suprapatellar recess.   Electronically Signed   By: Nelson Chimes M.D.   On: 05/13/2015 10:01   Dg Hip Unilat With Pelvis 2-3 Views Right  05/13/2015   CLINICAL DATA:  Recent fall with right hip pain, initial encounter  EXAM: RIGHT HIP (WITH PELVIS) 2-3 VIEWS  COMPARISON:  None.  FINDINGS: The pelvic ring is intact. Severe degenerative changes of the hip joints are noted bilaterally. There is lucency identified in the region of greater trochanter and extending inferiorly which is highly suspicious for undisplaced fracture. Cross-sectional imaging may be helpful for further evaluation. Diffuse aortic calcifications are again identified and stable.  IMPRESSION: Changes suspicious for intratrochanteric right femoral fracture.  Aortic calcifications with mild aneurysmal dilatation stable from the prior exam.   Electronically Signed   By: Inez Catalina M.D.   On: 05/13/2015 10:00    Labs:  Recent Labs  05/13/15 0842  WBC 6.7  RBC 4.54  HCT 43.1  PLT 188    Recent Labs  05/13/15 0842  NA 142  K 3.1*  CL 104  CO2 29  BUN 9  CREATININE 0.75  GLUCOSE 96  CALCIUM 9.1   No results for input(s): LABPT, INR in the last 72 hours.  Review of Systems: Review of Systems  Unable to perform ROS: dementia  dementia. Son present.   Physical Exam: Patient resting.  Son in room.  NVI.   General. Alert oriented times one only.  HEENT, atraumatic tracts with EOM.  Neck supple thyroid normal.  Lungs : no wheezing Heart:  No Murmur. Regular  Abd:  Soft , no rebound.  Extrem: Pain with right hip ROM, right LE short ER . Palp pulses. No knee effusion. Left hip less than 20 degrees ROM.   Neuro:  confused, oriented times one.   Lymph: no inguinal lymphadenopathy.  Assessment and Plan: Right IT hip fracture Patients son advise that best treatment option at this point is ORIF (right troch nail) procedure.  Surgical procedure along with possible recovery time, risks/complications discussed.  All questions answered.  She has dementia, son at bedside. She has pre-existing Hip OA  Both hips but does not complain of pain. Likes to walk .  Discussed surgery with her increased mortality and morbidity from mental status and carotid  disease. Risks of stroke , aspiration, death loss of fixation , reoperation, screw cutout from soft bone included. Son is POA and he agrees to proceed.     Alyson Locket. Alvino Blood For Dr Rodell Perna [facility] [phone]

## 2015-05-13 NOTE — Consult Note (Addendum)
Patient ID: Brittney Graham MRN: [id number] DOB/AGE: 1924-05-16 81 y.o.  Admit date: 05/13/2015  Admission Diagnoses:  Principal Problem:   Hip fracture, right Active Problems:   Vascular dementia with depressed mood   Hyperlipidemia   Osteoporosis   UTI (lower urinary tract infection)   Accelerated hypertension   Hip fracture   HPI: Elderly patient with dementia.  Son present to give history.  Fell today.  xrays in ED showed a nondisplaced right IT hip fracture.  [facility] assisted living , past Hx of poor ambulation with hip OA. No C/O left hip pain. She has pain when she moves her right hip.oriented times one . Poor historian.   Past Medical History: Past Medical History  Diagnosis Date  . Peripheral vascular disease, unspecified   . Unspecified glaucoma   . Vascular dementia with depressed mood   . Loss of weight   . Vitamin deficiency   . Hyperlipidemia   . Restless legs syndrome (RLS)   . Occlusion and stenosis of carotid artery without mention of cerebral infarction   . Osteoporosis     Surgical History: Past Surgical History  Procedure Laterality Date  . Appendectomy    . Carotid endarterectomy  07/14/1968  . Colonoscopy  2008    Normal     Family History: No family history on file.  Social History: History   Social History  . Marital Status: Widowed    Spouse Name: N/A  . Number of Children: N/A  . Years of Education: N/A   Occupational History  . Not on file.   Social History Main Topics  . Smoking status: Current Every Day Smoker -- 2.00 packs/day for 50 years    Types: Cigarettes  . Smokeless tobacco: Not on file  . Alcohol Use: No  . Drug Use: No  . Sexual Activity: Not on file   Other Topics Concern  . Not on file   Social History Narrative    Allergies: Review of patient's allergies indicates no known allergies.  Medications: I have reviewed the patient's current medications.  Vital Signs: Patient Vitals for the past  24 hrs:  BP Temp Temp src Pulse Resp SpO2  05/13/15 1315 (!) 150/55 mmHg 98.1 F (36.7 C) Oral 78 18 98 %  05/13/15 1230 167/62 mmHg - - 65 - 98 %  05/13/15 1200 182/88 mmHg - - (!) 59 - 96 %  05/13/15 1143 181/63 mmHg - - 67 22 100 %  05/13/15 1112 - - - 65 (!) 35 98 %  05/13/15 1109 200/87 mmHg - - - - -  05/13/15 1022 (!) 207/74 mmHg - - (!) 53 15 97 %  05/13/15 0950 - - - - 14 -  05/13/15 0949 - - - (!) 56 - 94 %  05/13/15 0948 197/69 mmHg - - - - -  05/13/15 0845 200/60 mmHg - - (!) 50 13 96 %  05/13/15 0830 (!) 224/80 mmHg - - (!) 49 15 96 %  05/13/15 0817 (!) 205/60 mmHg 98 F (36.7 C) Oral (!) 56 19 95 %    Radiology: Dg Chest 1 View  05/13/2015   CLINICAL DATA:  [facility].  Confusion.  EXAM: CHEST  1 VIEW  COMPARISON:  04/15/2010.  FINDINGS: Heart size is normal. There is calcification of the thoracic aorta. There is a hiatal hernia. No evidence of pneumothorax or hemothorax. There are some areas of indistinct density in both lungs that could represent scarring. Nodules are not excluded.  Two-view chest radiography suggested when able. No acute bone finding.  IMPRESSION: No acute or traumatic finding.  Hiatal hernia.  Indistinct pulmonary densities that could represent scarring. Two-view chest radiography recommended when able to assess for the possibility of nodules.   Electronically Signed   By: Nelson Chimes M.D.   On: 05/13/2015 10:03   Ct Head Wo Contrast  05/13/2015   CLINICAL DATA:  Fall 2 times starting Saturday, right knee pain  EXAM: CT HEAD WITHOUT CONTRAST  TECHNIQUE: Contiguous axial images were obtained from the base of the skull through the vertex without intravenous contrast.  COMPARISON:  04/21/2010  FINDINGS: There is 7 mm high-density nodule in the roof of left maxillary sinus.  Atherosclerotic calcifications of carotid siphon. No intracranial hemorrhage, mass effect or midline shift. Small lacunar infarct is noted in right basal ganglia. Moderate cerebral atrophy.  Periventricular and patchy subcortical white matter decreased attenuation probable due to chronic small vessel ischemic changes. No definite acute cortical infarction. No mass lesion is noted on this unenhanced scan.  IMPRESSION: 1. No acute intracranial abnormality. Moderate cerebral atrophy. Periventricular and patchy subcortical white matter decreased attenuation probable due to chronic small vessel ischemic changes. There is probable partially calcified 7 mm nodule in the roof of left maxillary sinus. 2. No definite acute cortical infarction. Small lacunar infarct in right basal ganglia.   Electronically Signed   By: Lahoma Crocker M.D.   On: 05/13/2015 09:30   Ct Cervical Spine Wo Contrast  05/13/2015   CLINICAL DATA:  Multiple recent falls. The patient denies neck pain.  EXAM: CT CERVICAL SPINE WITHOUT CONTRAST  TECHNIQUE: Multidetector CT imaging of the cervical spine was performed without intravenous contrast. Multiplanar CT image reconstructions were also generated.  COMPARISON:  CT head without contrast from the same day.  FINDINGS: Mild osteopenia is present. Leftward curvature of the cervical spine is present. Vertebral body heights are maintained. Slight degenerative anterolisthesis is present at C3-4.  The soft tissues the neck demonstrate atherosclerotic changes of the carotid arteries bilaterally denser calcifications are present on the left. The thyroid is somewhat heterogeneous. The dominant right posterior lesion measures at least 1.6 cm. Calcifications are present in the left lobe. The lung apices are clear.  Degenerative changes are present at C1-2 without significant stenosis.  C2-3: The facet joints are fused bilaterally. There is no significant stenosis.  C3-4: Asymmetric right-sided facet hypertrophy and uncovertebral disease results in moderate right foraminal stenosis. The central canal and left foramen are patent.  C4-5: Uncovertebral and facet disease leads to moderate left and mild right  foraminal stenosis.  C5-6:  Asymmetric mild left foraminal narrowing at C5-6.  C6-7: Uncovertebral spurring leads to moderate left foraminal stenosis.  C7-T1:  Negative.  IMPRESSION: 1. Multilevel spondylosis of the cervical spine with mild leftward curvature. 2. No acute abnormality. 3. Atherosclerotic changes of the carotid bifurcations appear worse on the left.   Electronically Signed   By: San Morelle M.D.   On: 05/13/2015 11:27   Ct Hip Right Wo Contrast  05/13/2015   CLINICAL DATA:  Status post fall.  Right hip pain.  EXAM: CT OF THE RIGHT HIP WITHOUT CONTRAST  TECHNIQUE: Multidetector CT imaging of the right hip was performed according to the standard protocol. Multiplanar CT image reconstructions were also generated.  COMPARISON:  None.  FINDINGS: There is severe osteopenia. There is a comminuted right intertrochanteric fracture. There is no dislocation. There is severe osteoarthritis of the right hip with a bone-on-bone  appearance and subchondral cystic changes. There are bulky marginal osteophytes.  There is a 2.5 cm hyperdense area in the subcutaneous fat overlying the right greater trochanter consistent with a hematoma. There is no other fluid collection or scratch at there is no focal fluid collection.  There is rib rectal fecal impaction.  There is peripheral vascular atherosclerotic disease.  IMPRESSION: 1. Comminuted right intertrochanteric fracture without dislocation. 2. Severe osteoarthritis of the right hip.   Electronically Signed   By: Kathreen Devoid   On: 05/13/2015 11:41   Dg Knee Complete 4 Views Right  05/13/2015   CLINICAL DATA:  Confusion.  Fell.  Swelling and pain.  EXAM: RIGHT KNEE - COMPLETE 4+ VIEW  COMPARISON:  None.  FINDINGS: The bones are osteopenic. No joint effusion. There is chronic calcification in the distal quadriceps tendon and possibly in the suprapatellar recess of the joint. No sign of acute fracture of the patella or extensor mechanism. There is chronic  chondrocalcinosis and chronic vascular calcification. No acute bony injury.  IMPRESSION: No acute injury seen. Chronic appearing calcification in the quadriceps/patellar extensor mechanism region. No suspicion of acute fracture. No joint effusion. There could be small intra-articular loose bodies in the suprapatellar recess.   Electronically Signed   By: Nelson Chimes M.D.   On: 05/13/2015 10:01   Dg Hip Unilat With Pelvis 2-3 Views Right  05/13/2015   CLINICAL DATA:  Recent fall with right hip pain, initial encounter  EXAM: RIGHT HIP (WITH PELVIS) 2-3 VIEWS  COMPARISON:  None.  FINDINGS: The pelvic ring is intact. Severe degenerative changes of the hip joints are noted bilaterally. There is lucency identified in the region of greater trochanter and extending inferiorly which is highly suspicious for undisplaced fracture. Cross-sectional imaging may be helpful for further evaluation. Diffuse aortic calcifications are again identified and stable.  IMPRESSION: Changes suspicious for intratrochanteric right femoral fracture.  Aortic calcifications with mild aneurysmal dilatation stable from the prior exam.   Electronically Signed   By: Inez Catalina M.D.   On: 05/13/2015 10:00    Labs:  Recent Labs  05/13/15 0842  WBC 6.7  RBC 4.54  HCT 43.1  PLT 188    Recent Labs  05/13/15 0842  NA 142  K 3.1*  CL 104  CO2 29  BUN 9  CREATININE 0.75  GLUCOSE 96  CALCIUM 9.1   No results for input(s): LABPT, INR in the last 72 hours.  Review of Systems: Review of Systems  Unable to perform ROS: dementia  dementia. Son present.   Physical Exam: Patient resting.  Son in room.  NVI.   General. Alert oriented times one only.  HEENT, atraumatic tracts with EOM.  Neck supple thyroid normal.  Lungs : no wheezing Heart:  No Murmur. Regular  Abd:  Soft , no rebound.  Extrem: Pain with right hip ROM, right LE short ER . Palp pulses. No knee effusion. Left hip less than 20 degrees ROM.   Neuro:  confused, oriented times one.   Lymph: no inguinal lymphadenopathy.  Assessment and Plan: Right IT hip fracture Patients son advise that best treatment option at this point is ORIF (right troch nail) procedure.  Surgical procedure along with possible recovery time, risks/complications discussed.  All questions answered.  She has dementia, son at bedside. She has pre-existing Hip OA  Both hips but does not complain of pain. Likes to walk .  Discussed surgery with her increased mortality and morbidity from mental status and carotid  disease. Risks of stroke , aspiration, death loss of fixation , reoperation, screw cutout from soft bone included. Son is POA and he agrees to proceed.     Alyson Locket. Alvino Blood For Dr Rodell Perna [facility] [phone]

## 2015-05-13 NOTE — ED Notes (Signed)
Patient transported to CT 

## 2015-05-13 NOTE — ED Notes (Signed)
Pt. With bilateral skin tears to elbows. Pt. Skin tears cleaned and dressed according to Tamala Julian, NP orders. Pt. Tolerated dressing well.

## 2015-05-13 NOTE — Anesthesia Postprocedure Evaluation (Signed)
  Anesthesia Post-op Note  Patient: Brittney Graham  Procedure(s) Performed: Procedure(s) (LRB): INTRAMEDULLARY (IM) NAIL INTERTROCHANTRIC (Right)  Patient Location: PACU  Anesthesia Type: General  Level of Consciousness: awake and alert   Airway and Oxygen Therapy: Patient Spontanous Breathing  Post-op Pain: mild  Post-op Assessment: Post-op Vital signs reviewed, Patient's Cardiovascular Status Stable, Respiratory Function Stable, Patent Airway and No signs of Nausea or vomiting  Last Vitals:  Filed Vitals:   05/13/15 2015  BP: 138/71  Pulse: 97  Temp:   Resp: 16    Post-op Vital Signs: stable   Complications: No apparent anesthesia complications

## 2015-05-13 NOTE — Brief Op Note (Signed)
05/13/2015  9:23 PM  PATIENT:  Brittney Graham  81 y.o. female  PRE-OPERATIVE DIAGNOSIS:  right hip fracture  POST-OPERATIVE DIAGNOSIS:  right hip fracture  PROCEDURE:  Procedure(s): INTRAMEDULLARY (IM) NAIL INTERTROCHANTRIC (Right)  SURGEON:  Surgeon(s) and Role:    Marybelle Killings, MD - Primary  PHYSICIAN ASSISTANT: Kameron Blethen m. Icy Fuhrmann pa-c   ANESTHESIA:   general  EBL:  Total I/O In: 600 [I.V.:600] Out: 200 [Blood:200]  BLOOD ADMINISTERED:none  DRAINS: none   LOCAL MEDICATIONS USED:  MARCAINE     SPECIMEN:  No Specimen  DISPOSITION OF SPECIMEN:  N/A  COUNTS:  YES  TOURNIQUET:  * No tourniquets in log *   PATIENT DISPOSITION:  PACU - hemodynamically stable.

## 2015-05-13 NOTE — Transfer of Care (Signed)
Immediate Anesthesia Transfer of Care Note  Patient: Brittney Graham  Procedure(s) Performed: Procedure(s): INTRAMEDULLARY (IM) NAIL INTERTROCHANTRIC (Right)  Patient Location: PACU  Anesthesia Type:General  Level of Consciousness: confused, lethargic and responds to stimulation  Airway & Oxygen Therapy: Patient Spontanous Breathing and Patient connected to face mask oxygen  Post-op Assessment: Report given to RN, Post -op Vital signs reviewed and stable, Patient moving all extremities and Patient moving all extremities X 4  Post vital signs: Reviewed and stable  Last Vitals:  Filed Vitals:   05/13/15 2012  BP:   Pulse:   Temp: 36.2 C  Resp:     Complications: No apparent anesthesia complications

## 2015-05-13 NOTE — Anesthesia Procedure Notes (Signed)
Procedure Name: Intubation Date/Time: 05/13/2015 6:54 PM Performed by: Jacquiline Doe A Pre-anesthesia Checklist: Patient identified, Emergency Drugs available, Suction available, Patient being monitored and Timeout performed Patient Re-evaluated:Patient Re-evaluated prior to inductionOxygen Delivery Method: Circle system utilized Preoxygenation: Pre-oxygenation with 100% oxygen Intubation Type: IV induction Ventilation: Mask ventilation without difficulty Laryngoscope Size: Mac and 3 Grade View: Grade I Tube type: Oral Tube size: 7.0 mm Number of attempts: 1 Airway Equipment and Method: Stylet Placement Confirmation: ETT inserted through vocal cords under direct vision,  positive ETCO2 and breath sounds checked- equal and bilateral Secured at: 22 cm Tube secured with: Tape Dental Injury: Teeth and Oropharynx as per pre-operative assessment

## 2015-05-13 NOTE — ED Notes (Signed)
Pt coming from [facility]. Pt. Fell last evening and again this AM. Staff states she fell last night on carpet and this AM on hard floor. Pt. With dementia at baseline now. Alert and oriented to person only. Pt. Complaint of R hip and knee pain. Bruise to R knee. Skin tears to bilateral elbow areas. Pt. With external rotation to R leg. C-collar in place. No blood thinners on board. Hypertensive on arrival. Pt. Received 100 of fentanyl.

## 2015-05-13 NOTE — Interval H&P Note (Signed)
History and Physical Interval Note:  05/13/2015 6:37 PM  Brittney Graham  has presented today for surgery, with the diagnosis of right hip fracture  The various methods of treatment have been discussed with the patient and family. After consideration of risks, benefits and other options for treatment, the patient has consented to  Procedure(s): INTRAMEDULLARY (IM) NAIL INTERTROCHANTRIC (Right) as a surgical intervention .  The patient's history has been reviewed, patient examined, no change in status, stable for surgery.  I have reviewed the patient's chart and labs.  Questions were answered to the patient's satisfaction.     [name] C

## 2015-05-13 NOTE — ED Notes (Signed)
Pt. Found to be removing all EKG leads. Pt. Placed back on BP cuff and SPO2 monitor but EKG leads remain off.

## 2015-05-13 NOTE — H&P (Signed)
History and Physical        [facility] Admission Note Date: 05/13/2015  Patient name: Brittney Graham Medical record number: [id number] Date of birth: 05-Jul-1924 Age: 81 y.o. Gender: female  PCP: Hollace Kinnier, DO  Referring physician: Aleene Davidson, NP  Chief Complaint:  Multiple falls with right hip pain  HPI: Patient is a 81 year old female with hypertension, hyperlipidemia, osteoporosis, dementia lives in [facility] facility presented with a fall. Patient is a very poor historian due to dementia and currently has UTI, alert and oriented to self only. Per EMS report, patient had a fall last night on the carpet and again fell this morning on the hard floor. Patient however was not able to relay to me the circumstances around both falls, stated that she does not remember the falls. ED workup showed UA positive for UTI, BMET unremarkable except potassium of 3.1, troponin 0.03, CBC unremarkable CT of the right hip showed comminuted right intertrochanteric fracture without dislocation, severe osteoarthritis of the right hip  Review of Systems:  Unable to obtain from the patient due to dementia and mental status  Past Medical History: Past Medical History  Diagnosis Date  . Peripheral vascular disease, unspecified   . Unspecified glaucoma   . Vascular dementia with depressed mood   . Loss of weight   . Vitamin deficiency   . Hyperlipidemia   . Restless legs syndrome (RLS)   . Occlusion and stenosis of carotid artery without mention of cerebral infarction   . Osteoporosis     Past Surgical History  Procedure Laterality Date  . Appendectomy    . Carotid endarterectomy  07/14/1968  . Colonoscopy  2008    Normal     Medications: Prior to Admission medications   Medication Sig Start Date End Date Taking? Authorizing Provider  aspirin 81 MG tablet  Take 81 mg by mouth daily.   Yes Historical Provider, MD  bimatoprost (LUMIGAN) 0.03 % ophthalmic solution 1 drop at bedtime.   Yes Historical Provider, MD  brimonidine (ALPHAGAN) 0.15 % ophthalmic solution 1 drop 2 (two) times daily.   Yes Historical Provider, MD  cholecalciferol (VITAMIN D) 1000 UNITS tablet Take 1,000 Units by mouth daily.   Yes Historical Provider, MD  CRESTOR 10 MG tablet TAKE 1 TABLET EVERY DAY FOR CHOLESTEROL   Yes Tiffany L Reed, DO  donepezil (ARICEPT) 10 MG tablet Take one tablet every day for memory  04/16/14  Yes Tiffany L Reed, DO  dorzolamide-timolol (COSOPT) 22.3-6.8 MG/ML ophthalmic solution 1 drop 2 (two) times daily.   Yes Historical Provider, MD  ENSURE (ENSURE) Take 237 mLs by mouth 2 (two) times daily between meals.   Yes Historical Provider, MD  Memantine HCl ER (NAMENDA XR) 28 MG CP24 Take 28 mg by mouth daily. 05/17/14  Yes Tiffany L Reed, DO  mirtazapine (REMERON) 15 MG tablet Take 1 tablet (15 mg total) by mouth at bedtime. Patient not taking: Reported on 05/13/2015 05/17/14   Gayland Curry, DO    Allergies:  No Known Allergies  Social History: Per records,  Per records, she has been smoking Cigarettes.  She has a 100 pack-year smoking history. She  does not have any smokeless tobacco history on file. She reports that she does not drink alcohol or use illicit drugs. she is a resident of assisted living facility and ambulance with a cane  Family History: No family history on file.  Physical Exam: Blood pressure 182/88, pulse 59, temperature 98 F (36.7 C), temperature source Oral, resp. rate 22, SpO2 96 %. General: Alert, awake, oriented x self, in no acute distress. HEENT: normocephalic, atraumatic, anicteric sclera, pink conjunctiva, pupils equal and reactive to light and accomodation, oropharynx clear Neck: supple, no masses or lymphadenopathy, no goiter, no bruits  Heart: Regular rate and rhythm, without murmurs, rubs or gallops. Lungs: Clear to  auscultation bilaterally, no wheezing, rales or rhonchi. Abdomen: Soft, nontender, nondistended, positive bowel sounds, no masses. Extremities: No clubbing, cyanosis or edema with positive pedal pulses. Right hip pain and tenderness Neuro: Grossly intact, no focal neurological deficits, strength 5/5 upper and lower extremities bilaterally, able to lift leg, pain in rt hip Psych: alert and oriented x self, normal mood and affect Skin: no rashes or lesions, warm and dry   LABS on Admission:  Basic Metabolic Panel:  Recent Labs Lab 05/13/15 0842  NA 142  K 3.1*  CL 104  CO2 29  GLUCOSE 96  BUN 9  CREATININE 0.75  CALCIUM 9.1   Liver Function Tests: No results for input(s): AST, ALT, ALKPHOS, BILITOT, PROT, ALBUMIN in the last 168 hours. No results for input(s): LIPASE, AMYLASE in the last 168 hours. No results for input(s): AMMONIA in the last 168 hours. CBC:  Recent Labs Lab 05/13/15 0842  WBC 6.7  NEUTROABS 5.3  HGB 14.2  HCT 43.1  MCV 94.9  PLT 188   Cardiac Enzymes:  Recent Labs Lab 05/13/15 0842  TROPONINI 0.03   BNP: Invalid input(s): POCBNP CBG: No results for input(s): GLUCAP in the last 168 hours.  Radiological Exams on Admission:  Dg Chest 1 View  05/13/2015   CLINICAL DATA:  [place].  Confusion.  EXAM: CHEST  1 VIEW  COMPARISON:  04/15/2010.  FINDINGS: Heart size is normal. There is calcification of the thoracic aorta. There is a hiatal hernia. No evidence of pneumothorax or hemothorax. There are some areas of indistinct density in both lungs that could represent scarring. Nodules are not excluded. Two-view chest radiography suggested when able. No acute bone finding.  IMPRESSION: No acute or traumatic finding.  Hiatal hernia.  Indistinct pulmonary densities that could represent scarring. Two-view chest radiography recommended when able to assess for the possibility of nodules.   Electronically Signed   By: Nelson Chimes M.D.   On: 05/13/2015 10:03   Ct Head  Wo Contrast  05/13/2015   CLINICAL DATA:  Fall 2 times starting Saturday, right knee pain  EXAM: CT HEAD WITHOUT CONTRAST  TECHNIQUE: Contiguous axial images were obtained from the base of the skull through the vertex without intravenous contrast.  COMPARISON:  04/21/2010  FINDINGS: There is 7 mm high-density nodule in the roof of left maxillary sinus.  Atherosclerotic calcifications of carotid siphon. No intracranial hemorrhage, mass effect or midline shift. Small lacunar infarct is noted in right basal ganglia. Moderate cerebral atrophy. Periventricular and patchy subcortical white matter decreased attenuation probable due to chronic small vessel ischemic changes. No definite acute cortical infarction. No mass lesion is noted on this unenhanced scan.  IMPRESSION: 1. No acute intracranial abnormality. Moderate cerebral atrophy. Periventricular and patchy subcortical white matter decreased attenuation probable due to chronic small vessel ischemic changes. There  is probable partially calcified 7 mm nodule in the roof of left maxillary sinus. 2. No definite acute cortical infarction. Small lacunar infarct in right basal ganglia.   Electronically Signed   By: Lahoma Crocker M.D.   On: 05/13/2015 09:30   Ct Cervical Spine Wo Contrast  05/13/2015   CLINICAL DATA:  Multiple recent falls. The patient denies neck pain.  EXAM: CT CERVICAL SPINE WITHOUT CONTRAST  TECHNIQUE: Multidetector CT imaging of the cervical spine was performed without intravenous contrast. Multiplanar CT image reconstructions were also generated.  COMPARISON:  CT head without contrast from the same day.  FINDINGS: Mild osteopenia is present. Leftward curvature of the cervical spine is present. Vertebral body heights are maintained. Slight degenerative anterolisthesis is present at C3-4.  The soft tissues the neck demonstrate atherosclerotic changes of the carotid arteries bilaterally denser calcifications are present on the left. The thyroid is  somewhat heterogeneous. The dominant right posterior lesion measures at least 1.6 cm. Calcifications are present in the left lobe. The lung apices are clear.  Degenerative changes are present at C1-2 without significant stenosis.  C2-3: The facet joints are fused bilaterally. There is no significant stenosis.  C3-4: Asymmetric right-sided facet hypertrophy and uncovertebral disease results in moderate right foraminal stenosis. The central canal and left foramen are patent.  C4-5: Uncovertebral and facet disease leads to moderate left and mild right foraminal stenosis.  C5-6:  Asymmetric mild left foraminal narrowing at C5-6.  C6-7: Uncovertebral spurring leads to moderate left foraminal stenosis.  C7-T1:  Negative.  IMPRESSION: 1. Multilevel spondylosis of the cervical spine with mild leftward curvature. 2. No acute abnormality. 3. Atherosclerotic changes of the carotid bifurcations appear worse on the left.   Electronically Signed   By: San Morelle M.D.   On: 05/13/2015 11:27   Ct Hip Right Wo Contrast  05/13/2015   CLINICAL DATA:  Status post fall.  Right hip pain.  EXAM: CT OF THE RIGHT HIP WITHOUT CONTRAST  TECHNIQUE: Multidetector CT imaging of the right hip was performed according to the standard protocol. Multiplanar CT image reconstructions were also generated.  COMPARISON:  None.  FINDINGS: There is severe osteopenia. There is a comminuted right intertrochanteric fracture. There is no dislocation. There is severe osteoarthritis of the right hip with a bone-on-bone appearance and subchondral cystic changes. There are bulky marginal osteophytes.  There is a 2.5 cm hyperdense area in the subcutaneous fat overlying the right greater trochanter consistent with a hematoma. There is no other fluid collection or scratch at there is no focal fluid collection.  There is rib rectal fecal impaction.  There is peripheral vascular atherosclerotic disease.  IMPRESSION: 1. Comminuted right intertrochanteric  fracture without dislocation. 2. Severe osteoarthritis of the right hip.   Electronically Signed   By: Kathreen Devoid   On: 05/13/2015 11:41   Dg Knee Complete 4 Views Right  05/13/2015   CLINICAL DATA:  Confusion.  Fell.  Swelling and pain.  EXAM: RIGHT KNEE - COMPLETE 4+ VIEW  COMPARISON:  None.  FINDINGS: The bones are osteopenic. No joint effusion. There is chronic calcification in the distal quadriceps tendon and possibly in the suprapatellar recess of the joint. No sign of acute fracture of the patella or extensor mechanism. There is chronic chondrocalcinosis and chronic vascular calcification. No acute bony injury.  IMPRESSION: No acute injury seen. Chronic appearing calcification in the quadriceps/patellar extensor mechanism region. No suspicion of acute fracture. No joint effusion. There could be small intra-articular loose bodies  in the suprapatellar recess.   Electronically Signed   By: Nelson Chimes M.D.   On: 05/13/2015 10:01   Dg Hip Unilat With Pelvis 2-3 Views Right  05/13/2015   CLINICAL DATA:  Recent fall with right hip pain, initial encounter  EXAM: RIGHT HIP (WITH PELVIS) 2-3 VIEWS  COMPARISON:  None.  FINDINGS: The pelvic ring is intact. Severe degenerative changes of the hip joints are noted bilaterally. There is lucency identified in the region of greater trochanter and extending inferiorly which is highly suspicious for undisplaced fracture. Cross-sectional imaging may be helpful for further evaluation. Diffuse aortic calcifications are again identified and stable.  IMPRESSION: Changes suspicious for intratrochanteric right femoral fracture.  Aortic calcifications with mild aneurysmal dilatation stable from the prior exam.   Electronically Signed   By: Inez Catalina M.D.   On: 05/13/2015 10:00    *I have personally reviewed the images above*  EKG: Independently reviewed. Rate 48, normal sinus rhythm, LVH, mild T-wave inversions in lead 5 and 6   Assessment/Plan Principal  Problem:   Hip fracture, right due to multiple falls, osteoporosis - Patient poor historian, has dementia, oriented 1 - EDP calling orthopedics on call - Patient placed on hip fracture protocol, keep NPO, gentle hydration, pain control -  will likely need skilled nursing facility   Active Problems:   Vascular dementia with depressed mood Currently oriented 1, continue Aricept, Namenda     Hyperlipidemia - Continue Crestor    UTI (lower urinary tract infection) - Obtain urine culture and sensitivities, continue IV Rocephin    Accelerated hypertension - Currently NPO, placed on IV hydralazine as needed with parameters  Hypokalemia  Replaced   DVT prophylaxis: SCDs, pending surgery. After surgery, patient will be started on prophylactic anticoagulation   CODE STATUS: Currently full CODE STATUS   Family Communication:  no family member at the bedside.  Disposition plan: Further plan will depend as patient's clinical course evolves and further radiologic and laboratory data become available. Likely skilled nursing facility   Time Spent on Admission: 60 mins   Kalleigh Harbor M.D. [facility] 05/13/2015, 12:21 PM Pager: [phone]  If 7PM-7AM, please contact night-coverage [email or web address] Password [email or web address]

## 2015-05-13 NOTE — ED Notes (Signed)
Pt. With yellow falls armband, yellow socks, side rails up, bed low and locked, posey stretcher alarm in place.

## 2015-05-14 ENCOUNTER — Encounter (HOSPITAL_COMMUNITY): Payer: Self-pay | Admitting: Orthopaedic Surgery

## 2015-05-14 DIAGNOSIS — I10 Essential (primary) hypertension: Secondary | ICD-10-CM

## 2015-05-14 LAB — BASIC METABOLIC PANEL
ANION GAP: 5 (ref 5–15)
BUN: 17 mg/dL (ref 6–20)
CALCIUM: 7.9 mg/dL — AB (ref 8.9–10.3)
CO2: 27 mmol/L (ref 22–32)
CREATININE: 0.87 mg/dL (ref 0.44–1.00)
Chloride: 105 mmol/L (ref 101–111)
GFR calc non Af Amer: 57 mL/min — ABNORMAL LOW (ref >60)
Glucose, Bld: 95 mg/dL (ref 65–99)
Potassium: 3.5 mmol/L (ref 3.5–5.1)
Sodium: 137 mmol/L (ref 135–145)

## 2015-05-14 LAB — CBC
HEMATOCRIT: 31.1 % — AB (ref 36.0–46.0)
Hemoglobin: 10 g/dL — ABNORMAL LOW (ref 12.0–15.0)
MCH: 30.7 pg (ref 26.0–34.0)
MCHC: 32.2 g/dL (ref 30.0–36.0)
MCV: 95.4 fL (ref 78.0–100.0)
Platelets: 158 10*3/uL (ref 150–400)
RBC: 3.26 MIL/uL — ABNORMAL LOW (ref 3.87–5.11)
RDW: 14.1 % (ref 11.5–15.5)
WBC: 12.3 10*3/uL — ABNORMAL HIGH (ref 4.0–10.5)

## 2015-05-14 LAB — MAGNESIUM: Magnesium: 1.6 mg/dL — ABNORMAL LOW (ref 1.7–2.4)

## 2015-05-14 MED ORDER — POTASSIUM CHLORIDE IN NACL 40-0.9 MEQ/L-% IV SOLN
INTRAVENOUS | Status: DC
  Administered 2015-05-14: 75 mL/h via INTRAVENOUS
  Filled 2015-05-14 (×3): qty 1000

## 2015-05-14 NOTE — Care Management (Signed)
Utilization review completed by Verlee Rossetti, RN BSN [phone]

## 2015-05-14 NOTE — Progress Notes (Signed)
Subjective: 1 Day Post-Op Procedure(s) (LRB): INTRAMEDULLARY (IM) NAIL INTERTROCHANTRIC (Right) Patient reports pain as mild.    Objective: Vital signs in last 24 hours: Temp:  [97.2 F (36.2 C)-98.5 F (36.9 C)] 98.5 F (36.9 C) (06/28 5465) Pulse Rate:  [49-104] 73 (06/28 0633) Resp:  [13-35] 16 (06/28 0633) BP: (105-224)/(54-109) 158/54 mmHg (06/28 0633) SpO2:  [92 %-100 %] 95 % (06/28 0633) FiO2 (%):  [28 %] 28 % (06/27 2249)  Intake/Output from previous day: 06/27 0701 - 06/28 0700 In: 1220 [P.O.:120; I.V.:1100] Out: 202 [Urine:2; Blood:200] Intake/Output this shift:     Recent Labs  05/13/15 0842  HGB 14.2    Recent Labs  05/13/15 0842  WBC 6.7  RBC 4.54  HCT 43.1  PLT 188    Recent Labs  05/13/15 0842  NA 142  K 3.1*  CL 104  CO2 29  BUN 9  CREATININE 0.75  GLUCOSE 96  CALCIUM 9.1   No results for input(s): LABPT, INR in the last 72 hours.  Neurologically intact  Assessment/Plan: 1 Day Post-Op Procedure(s) (LRB): INTRAMEDULLARY (IM) NAIL INTERTROCHANTRIC (Right) Up with therapy OOB to recliner daily.  Is WBAT. With mental status do not expect she will walk for a few weeks when pain decreases with healing.   YATES,MARK C 05/14/2015, 7:46 AM

## 2015-05-14 NOTE — Op Note (Signed)
[name], [name]             ACCOUNT NO.:  [id number]  MEDICAL RECORD NO.:  [id number]  LOCATION:  5N10C                        FACILITY:  [facility]  PHYSICIAN:  Quantavius Humm C. Lorin Mercy, M.D.    DATE OF BIRTH:  09-Jan-1924  DATE OF PROCEDURE:  05/13/2015 DATE OF DISCHARGE:                              OPERATIVE REPORT   PREOPERATIVE DIAGNOSIS:  Right intertrochanteric fracture.  POSTOPERATIVE DIAGNOSIS:  Right intertrochanteric fracture.  PROCEDURE:  Right trochanteric nail, 95 mm lag screw, short Affixus nail with distal interlock.  SURGEON:  Danine Hor C. Lorin Mercy, M.D.  ASSISTANT:  Benjiman Core PA-C was present for the entire procedure and medically necessary.  ESTIMATED BLOOD LOSS:  Less than 100 mL.  DESCRIPTION OF PROCEDURE:  After patient was placed on fracture table with careful padding and positioning, well leg holder and careful padding over the peroneal nerve, hip was prepped with DuraPrep after AP and lateral fluoroscopic pictures were seen after reduction.  Large shower curtain, Betadine, and Steri-Drape were applied after area had been squared with towels.  Incision was made laterally, proximal to trochanter.  Medius tendon was split in trying to start the pin directly at the tip.  It kept falling and the fracture site sliding either lateral or medial.  Finally it was started at appropriate position range and then the screw was inserted.  It was advanced and put at appropriate position and then the screw was drilled and measured and placed center and the neck might have been 1 mm anterior.  The head was extremely sclerotic since she had hip osteoarthritis bilaterally but was ambulatory with her Alzheimer's without any apparent hip pain.  The screw was inserted in good position on the lateral cortex.  Lateral transverse distal screw was then placed with the guide, irrigation, and standard closure.  The patient tolerated the procedure well and transferred to recovery room in stable  condition.  Instrument count and needle count was correct.  Closure was with 0 Vicryl in the gluteus medius, 2-0 Vicryl in subcutaneous tissue, skin staple closure, Marcaine infiltration, and postop dressing.     Emily Forse C. Lorin Mercy, M.D.     MCY/MEDQ  D:  05/13/2015  T:  05/14/2015  Job:  [id number]

## 2015-05-14 NOTE — Clinical Social Work Note (Signed)
Clinical Social Work Assessment  Patient Details  Name: Brittney Graham MRN: [id number] Date of Birth: 1924-07-12  Date of referral:  05/14/15               Reason for consult:  Facility Placement, Discharge Planning                Permission sought to share information with:  Family Supports Permission granted to share information::  Yes, Verbal Permission Granted  Name::     Brittney Graham  Agency::  [facility]  Relationship::  Son  Contact Information:  [phone]  Housing/Transportation Living arrangements for the past 2 months:  [place] Chief Executive Officer) Source of Information:  Adult Children Patient Interpreter Needed:  None Criminal Activity/Legal Involvement Pertinent to Current Situation/Hospitalization:  No - Comment as needed Significant Relationships:  Adult Children Lives with:  Facility Resident Do you feel safe going back to the place where you live?  Yes Need for family participation in patient care:  Yes (Comment) (Patient's son active in patient's care.)  Care giving concerns:  Patient's son expressed no concerns at this time.   Social Worker assessment / plan:  CSW received report, patient admitted from [facility]. CSW received call from admissions liaison for [facility] stating facility will be willing to have patient return and complete therapy at facility. CSW spoke with patient's son regarding PT recommendation and [facility] offer. Patient's son understanding, but states preference for patient to return to [facility] if able to accommodate patient's physical and medical needs. CSW confirmed with [facility] admissions liaison, patient able to return with [facility] to complete therapy. [facility] to update son on discharge plan.  Employment status:  Retired Nurse, adult PT Recommendations:  [place] / Referral to community resources:   [place]  Patient/Family's Response to care:  Patient's son understanding and agreeable to CSW plan of care.  Patient/Family's Understanding of and Emotional Response to Diagnosis, Current Treatment, and Prognosis:  Patient's son understanding and agreeable to CSW plan of care.  Emotional Assessment Appearance:  Other (Comment Required (CSW spoke with patient's son regarding discharge.) Attitude/Demeanor/Rapport:  Other (CSW spoke with patient's son regarding discharge.) Affect (typically observed):  Other (CSW spoke with patient's son regarding discharge.) Orientation:  Oriented to Self Alcohol / Substance use:  Not Applicable Psych involvement (Current and /or in the community):  No (Comment) (Not appropriate on this admission.)  Discharge Needs  Concerns to be addressed:  No discharge needs identified Readmission within the last 30 days:  No Current discharge risk:  None Barriers to Discharge:  No Barriers Identified   Caroline Sauger, LCSW 05/14/2015, 3:33 PM [phone]

## 2015-05-14 NOTE — Evaluation (Signed)
Physical Therapy Evaluation Patient Details Name: Brittney Graham MRN: [id number] DOB: Sep 26, 1924 Today's Date: 05/14/2015   History of Present Illness  Patient is a 81 y.o. female who reportedly fell at the assited living faciliity where she lives. Family (daughter-in-law reports that there may have been a couple falls but she is uncertain at this time. The patient underwent a Rt IM nail placement on 05/13/15.   Clinical Impression  Patient becoming increasingly agitated during session with attempts to mobilize. Unable to safely perform transfer as anticipated for patient and staff safety. Discussed mobility and agitation with nursing. Patient daughter-in-law present for session and expressed that they are concerned with the patients willingness to progress mobility. Family hoping that patient will be able to eventually return her current assisted living situation. Therapy will continue to attempt to mobilize as able.     Follow Up Recommendations SNF    Equipment Recommendations  None recommended by PT    Recommendations for Other Services       Precautions / Restrictions Precautions Precautions: Fall Restrictions Weight Bearing Restrictions: Yes RLE Weight Bearing: Weight bearing as tolerated      Mobility  Bed Mobility Overal bed mobility: Needs Assistance (difficult to assess due to agitation )             General bed mobility comments: Attempting assisted transfer to edge of bed, patient refusing and escilating in agitation  Transfers                    Ambulation/Gait                Stairs            Wheelchair Mobility    Modified Rankin (Stroke Patients Only)       Balance                                             Pertinent Vitals/Pain Pain Assessment: No/denies pain (denies at rest but expresses pain with attempt to move)    Home Living Family/patient expects to be discharged to:: Other (Comment) (pt  unable to comment, family uncertain, hoping to ALF)                      Prior Function Level of Independence: Needs assistance   Gait / Transfers Assistance Needed: rolling walker     Comments: patient unable to answer questions regarding prior function, family reports that she was using a walker when she would remember. Otherwise they are not sure of the amount of assistance she was receiving. The do confirm that she was ambulatory.l     Hand Dominance        Extremity/Trunk Assessment               Lower Extremity Assessment: Difficult to assess due to impaired cognition         Communication   Communication: No difficulties  Cognition Arousal/Alertness: Awake/alert Behavior During Therapy: Agitated;Restless Overall Cognitive Status: History of cognitive impairments - at baseline Area of Impairment: Orientation;Memory;Following commands;Safety/judgement;Awareness Orientation Level: Person;Place;Situation   Memory: Decreased recall of precautions Following Commands: Follows one step commands inconsistently Safety/Judgement: Decreased awareness of safety;Decreased awareness of deficits     General Comments: Patient with increasing aggitation during session and with attempts to mobilize    General Comments  Exercises        Assessment/Plan    PT Assessment Patient needs continued PT services  PT Diagnosis Generalized weakness;Difficulty walking;Altered mental status   PT Problem List Decreased strength;Decreased range of motion;Decreased activity tolerance;Decreased mobility;Decreased safety awareness  PT Treatment Interventions Gait training;Functional mobility training;Therapeutic activities;Therapeutic exercise;Patient/family education   PT Goals (Current goals can be found in the Care Plan section) Acute Rehab PT Goals Patient Stated Goal: none stated PT Goal Formulation: Patient unable to participate in goal setting    Frequency Min  3X/week   Barriers to discharge        Co-evaluation               End of Session   Activity Tolerance: Patient limited by pain;Treatment limited secondary to agitation (Session limited for patient and staff safety) Patient left: in bed;with call bell/phone within reach;with bed alarm set;with family/visitor present Nurse Communication: Mobility status;Other (comment) (inability to mobilize due to agitation)         Time: 1000-1017 PT Time Calculation (min) (ACUTE ONLY): 17 min   Charges:   PT Evaluation $Initial PT Evaluation Tier I: 1 Procedure     PT G Codes:        Cassell Clement, PT, CSCS Pager [phone] Office [phone]  05/14/2015, 12:04 PM

## 2015-05-14 NOTE — Progress Notes (Signed)
[facility] PROGRESS NOTE  Brittney Graham OEU:[id number] DOB: [birth year] DOA: 05/13/2015 PCP: Hollace Kinnier, DO  Assessment/Plan: Principal Problem:   Hip fracture, right Active Problems:   Vascular dementia with depressed mood   Hyperlipidemia   Osteoporosis   UTI (lower urinary tract infection)   Accelerated hypertension   Hip fracture     Right hip fracture, status post INTRAMEDULLARY (IM) NAIL INTERTROCHANTRIC (Right Weightbearing as tolerated DVT prophylaxis per orthopedics, aspirin 325 mg a day  UTI Continue Rocephin, follow urine culture  Vascular dementia with depressed mood Confused,, continue Aricept, Namenda, discussed with daughter-in-law by the bedside   Hyperlipidemia - Continue Crestor   UTI (lower urinary tract infection) Follow urine culture and sensitivities, continue IV Rocephin   Accelerated hypertension - Currently NPO, placed on IV hydralazine as needed with parameters  Hypokalemia  Replaced   Abnormal chest x-ray, discussed with daughter-in-law, they do not want any further studies to rule out pulmonary nodules or mass  Code Status:      Code Status Orders        Start     Ordered   05/13/15 2249  Full code   Continuous     05/13/15 2248     Family Communication: family updated about patient's clinical progress Disposition Plan:  As above    Brief narrative: 81 year old female with hypertension, hyperlipidemia, osteoporosis, dementia lives in [facility] presented with a fall. Patient is a very poor historian due to dementia and currently has UTI, alert and oriented to self only. Per EMS report, patient had a fall last night on the carpet and again fell this morning on the hard floor. Patient however was not able to relay to me the circumstances around both falls, stated that she does not remember the falls. ED workup showed UA positive for UTI, BMET unremarkable except potassium of  3.1, troponin 0.03, CBC unremarkable CT of the right hip showed comminuted right intertrochanteric fracture without dislocation, severe osteoarthritis of the right hip    Consultants:  Orthopedics  Procedures: Right trochanteric nail, 95 mm lag screw, short Affixus nail with distal interlock  Antibiotics: Anti-infectives    Start     Dose/Rate Route Frequency Ordered Stop   05/14/15 0700  ceFAZolin (ANCEF) IVPB 1 g/50 mL premix     1 g 100 mL/hr over 30 Minutes Intravenous  Once 05/13/15 2248 05/14/15 0711   05/13/15 1315  cefTRIAXone (ROCEPHIN) 1 g in dextrose 5 % 50 mL IVPB - Premix     1 g 100 mL/hr over 30 Minutes Intravenous Every 24 hours 05/13/15 1311     05/13/15 1130  cefTRIAXone (ROCEPHIN) 1 g in dextrose 5 % 50 mL IVPB     1 g 100 mL/hr over 30 Minutes Intravenous  Once 05/13/15 1129 05/13/15 1213         HPI/Subjective: Patient is demented, daughter-in-law is in the room  Objective: Filed Vitals:   05/13/15 2134 05/13/15 2249 05/14/15 0034 05/14/15 0633  BP: 131/72  128/65 158/54  Pulse: 87  80 73  Temp: 97.4 F (36.3 C)  98.4 F (36.9 C) 98.5 F (36.9 C)  TempSrc: Oral  Oral Oral  Resp: 16  16 16   SpO2: 93% 93% 94% 95%    Intake/Output Summary (Last 24 hours) at 05/14/15 1126 Last data filed at 05/14/15 0900  Gross per 24 hour  Intake   1340 ml  Output    202 ml  Net   1138 ml    Exam:  General: No acute respiratory distress, confused Lungs: Clear to auscultation bilaterally without wheezes or crackles Cardiovascular: Regular rate and rhythm without murmur gallop or rub normal S1 and S2 Abdomen: Nontender, nondistended, soft, bowel sounds positive, no rebound, no ascites, no appreciable mass Extremities: No significant cyanosis, clubbing, or edema bilateral lower extremities     Data Review   Micro Results Recent Results (from the past 240 hour(s))  Surgical pcr screen     Status: None   Collection Time: 05/13/15  5:49 PM  Result  Value Ref Range Status   MRSA, PCR NEGATIVE NEGATIVE Final   Staphylococcus aureus NEGATIVE NEGATIVE Final    Comment:        The Xpert SA Assay (FDA approved for NASAL specimens in patients over 41 years of age), is one component of a comprehensive surveillance program.  Test performance has been validated by [facility] for patients greater than or equal to 60 year old. It is not intended to diagnose infection nor to guide or monitor treatment.     Radiology Reports Dg Chest 1 View  05/13/2015   CLINICAL DATA:  [place].  Confusion.  EXAM: CHEST  1 VIEW  COMPARISON:  04/15/2010.  FINDINGS: Heart size is normal. There is calcification of the thoracic aorta. There is a hiatal hernia. No evidence of pneumothorax or hemothorax. There are some areas of indistinct density in both lungs that could represent scarring. Nodules are not excluded. Two-view chest radiography suggested when able. No acute bone finding.  IMPRESSION: No acute or traumatic finding.  Hiatal hernia.  Indistinct pulmonary densities that could represent scarring. Two-view chest radiography recommended when able to assess for the possibility of nodules.   Electronically Signed   By: Nelson Chimes M.D.   On: 05/13/2015 10:03   Ct Head Wo Contrast  05/13/2015   CLINICAL DATA:  Fall 2 times starting Saturday, right knee pain  EXAM: CT HEAD WITHOUT CONTRAST  TECHNIQUE: Contiguous axial images were obtained from the base of the skull through the vertex without intravenous contrast.  COMPARISON:  04/21/2010  FINDINGS: There is 7 mm high-density nodule in the roof of left maxillary sinus.  Atherosclerotic calcifications of carotid siphon. No intracranial hemorrhage, mass effect or midline shift. Small lacunar infarct is noted in right basal ganglia. Moderate cerebral atrophy. Periventricular and patchy subcortical white matter decreased attenuation probable due to chronic small vessel ischemic changes. No definite acute cortical infarction.  No mass lesion is noted on this unenhanced scan.  IMPRESSION: 1. No acute intracranial abnormality. Moderate cerebral atrophy. Periventricular and patchy subcortical white matter decreased attenuation probable due to chronic small vessel ischemic changes. There is probable partially calcified 7 mm nodule in the roof of left maxillary sinus. 2. No definite acute cortical infarction. Small lacunar infarct in right basal ganglia.   Electronically Signed   By: Lahoma Crocker M.D.   On: 05/13/2015 09:30   Ct Cervical Spine Wo Contrast  05/13/2015   CLINICAL DATA:  Multiple recent falls. The patient denies neck pain.  EXAM: CT CERVICAL SPINE WITHOUT CONTRAST  TECHNIQUE: Multidetector CT imaging of the cervical spine was performed without intravenous contrast. Multiplanar CT image reconstructions were also generated.  COMPARISON:  CT head without contrast from the same day.  FINDINGS: Mild osteopenia is present. Leftward curvature of the cervical spine is present. Vertebral body heights are maintained. Slight degenerative anterolisthesis is present at C3-4.  The soft tissues the neck  demonstrate atherosclerotic changes of the carotid arteries bilaterally denser calcifications are present on the left. The thyroid is somewhat heterogeneous. The dominant right posterior lesion measures at least 1.6 cm. Calcifications are present in the left lobe. The lung apices are clear.  Degenerative changes are present at C1-2 without significant stenosis.  C2-3: The facet joints are fused bilaterally. There is no significant stenosis.  C3-4: Asymmetric right-sided facet hypertrophy and uncovertebral disease results in moderate right foraminal stenosis. The central canal and left foramen are patent.  C4-5: Uncovertebral and facet disease leads to moderate left and mild right foraminal stenosis.  C5-6:  Asymmetric mild left foraminal narrowing at C5-6.  C6-7: Uncovertebral spurring leads to moderate left foraminal stenosis.  C7-T1:   Negative.  IMPRESSION: 1. Multilevel spondylosis of the cervical spine with mild leftward curvature. 2. No acute abnormality. 3. Atherosclerotic changes of the carotid bifurcations appear worse on the left.   Electronically Signed   By: San Morelle M.D.   On: 05/13/2015 11:27   Ct Hip Right Wo Contrast  05/13/2015   CLINICAL DATA:  Status post fall.  Right hip pain.  EXAM: CT OF THE RIGHT HIP WITHOUT CONTRAST  TECHNIQUE: Multidetector CT imaging of the right hip was performed according to the standard protocol. Multiplanar CT image reconstructions were also generated.  COMPARISON:  None.  FINDINGS: There is severe osteopenia. There is a comminuted right intertrochanteric fracture. There is no dislocation. There is severe osteoarthritis of the right hip with a bone-on-bone appearance and subchondral cystic changes. There are bulky marginal osteophytes.  There is a 2.5 cm hyperdense area in the subcutaneous fat overlying the right greater trochanter consistent with a hematoma. There is no other fluid collection or scratch at there is no focal fluid collection.  There is rib rectal fecal impaction.  There is peripheral vascular atherosclerotic disease.  IMPRESSION: 1. Comminuted right intertrochanteric fracture without dislocation. 2. Severe osteoarthritis of the right hip.   Electronically Signed   By: Kathreen Devoid   On: 05/13/2015 11:41   Dg Knee Complete 4 Views Right  05/13/2015   CLINICAL DATA:  Confusion.  Fell.  Swelling and pain.  EXAM: RIGHT KNEE - COMPLETE 4+ VIEW  COMPARISON:  None.  FINDINGS: The bones are osteopenic. No joint effusion. There is chronic calcification in the distal quadriceps tendon and possibly in the suprapatellar recess of the joint. No sign of acute fracture of the patella or extensor mechanism. There is chronic chondrocalcinosis and chronic vascular calcification. No acute bony injury.  IMPRESSION: No acute injury seen. Chronic appearing calcification in the  quadriceps/patellar extensor mechanism region. No suspicion of acute fracture. No joint effusion. There could be small intra-articular loose bodies in the suprapatellar recess.   Electronically Signed   By: Nelson Chimes M.D.   On: 05/13/2015 10:01   Dg Hip Operative Unilat With Pelvis Right  05/13/2015   CLINICAL DATA:  81 year old female with right hip fracture  EXAM: OPERATIVE RIGHT HIP (WITH PELVIS IF PERFORMED) 2 VIEWS  TECHNIQUE: Fluoroscopic spot image(s) were submitted for interpretation post-operatively.  FLUOROSCOPY TIME:  Radiation Exposure Index (as provided by the fluoroscopic device): Not provided  If the device does not provide the exposure index:  Fluoroscopy Time:  40 seconds  Number of Acquired Images:  3  COMPARISON:  Radiograph dated 05/13/2015  FINDINGS: Fluoroscopic images of the right hip demonstrate a right femoral intramedullary dynamic orthopedic hardware with intertrochanteric nail. No new fracture identified. There is osteoarthritic changes of the  right hip joint with axial migration of the femur and narrowing of the joint space.  IMPRESSION: Right femoral intramedullary dynamic orthopedic hardware with intratrochanteric nail. No new fracture identified.  Osteoarthritic changes of the right hip.   Electronically Signed   By: Anner Crete M.D.   On: 05/13/2015 20:31   Dg Hip Unilat With Pelvis 2-3 Views Right  05/13/2015   CLINICAL DATA:  Recent fall with right hip pain, initial encounter  EXAM: RIGHT HIP (WITH PELVIS) 2-3 VIEWS  COMPARISON:  None.  FINDINGS: The pelvic ring is intact. Severe degenerative changes of the hip joints are noted bilaterally. There is lucency identified in the region of greater trochanter and extending inferiorly which is highly suspicious for undisplaced fracture. Cross-sectional imaging may be helpful for further evaluation. Diffuse aortic calcifications are again identified and stable.  IMPRESSION: Changes suspicious for intratrochanteric right  femoral fracture.  Aortic calcifications with mild aneurysmal dilatation stable from the prior exam.   Electronically Signed   By: Inez Catalina M.D.   On: 05/13/2015 10:00     CBC  Recent Labs Lab 05/13/15 0842 05/14/15 0825  WBC 6.7 12.3*  HGB 14.2 10.0*  HCT 43.1 31.1*  PLT 188 158  MCV 94.9 95.4  MCH 31.3 30.7  MCHC 32.9 32.2  RDW 13.8 14.1  LYMPHSABS 1.0  --   MONOABS 0.3  --   EOSABS 0.1  --   BASOSABS 0.0  --     Chemistries   Recent Labs Lab 05/13/15 0842 05/14/15 0825  NA 142 137  K 3.1* 3.5  CL 104 105  CO2 29 27  GLUCOSE 96 95  BUN 9 17  CREATININE 0.75 0.87  CALCIUM 9.1 7.9*   ------------------------------------------------------------------------------------------------------------------ CrCl cannot be calculated (Unknown ideal weight.). ------------------------------------------------------------------------------------------------------------------ No results for input(s): HGBA1C in the last 72 hours. ------------------------------------------------------------------------------------------------------------------ No results for input(s): CHOL, HDL, LDLCALC, TRIG, CHOLHDL, LDLDIRECT in the last 72 hours. ------------------------------------------------------------------------------------------------------------------ No results for input(s): TSH, T4TOTAL, T3FREE, THYROIDAB in the last 72 hours.  Invalid input(s): FREET3 ------------------------------------------------------------------------------------------------------------------ No results for input(s): VITAMINB12, FOLATE, FERRITIN, TIBC, IRON, RETICCTPCT in the last 72 hours.  Coagulation profile No results for input(s): INR, PROTIME in the last 168 hours.  No results for input(s): DDIMER in the last 72 hours.  Cardiac Enzymes  Recent Labs Lab 05/13/15 0842  TROPONINI 0.03    ------------------------------------------------------------------------------------------------------------------ Invalid input(s): POCBNP   CBG: No results for input(s): GLUCAP in the last 168 hours.     Studies: Dg Chest 1 View  05/13/2015   CLINICAL DATA:  [place].  Confusion.  EXAM: CHEST  1 VIEW  COMPARISON:  04/15/2010.  FINDINGS: Heart size is normal. There is calcification of the thoracic aorta. There is a hiatal hernia. No evidence of pneumothorax or hemothorax. There are some areas of indistinct density in both lungs that could represent scarring. Nodules are not excluded. Two-view chest radiography suggested when able. No acute bone finding.  IMPRESSION: No acute or traumatic finding.  Hiatal hernia.  Indistinct pulmonary densities that could represent scarring. Two-view chest radiography recommended when able to assess for the possibility of nodules.   Electronically Signed   By: Nelson Chimes M.D.   On: 05/13/2015 10:03   Ct Head Wo Contrast  05/13/2015   CLINICAL DATA:  Fall 2 times starting Saturday, right knee pain  EXAM: CT HEAD WITHOUT CONTRAST  TECHNIQUE: Contiguous axial images were obtained from the base of the skull through the vertex without intravenous contrast.  COMPARISON:  04/21/2010  FINDINGS: There is 7 mm high-density nodule in the roof of left maxillary sinus.  Atherosclerotic calcifications of carotid siphon. No intracranial hemorrhage, mass effect or midline shift. Small lacunar infarct is noted in right basal ganglia. Moderate cerebral atrophy. Periventricular and patchy subcortical white matter decreased attenuation probable due to chronic small vessel ischemic changes. No definite acute cortical infarction. No mass lesion is noted on this unenhanced scan.  IMPRESSION: 1. No acute intracranial abnormality. Moderate cerebral atrophy. Periventricular and patchy subcortical white matter decreased attenuation probable due to chronic small vessel ischemic changes. There  is probable partially calcified 7 mm nodule in the roof of left maxillary sinus. 2. No definite acute cortical infarction. Small lacunar infarct in right basal ganglia.   Electronically Signed   By: Lahoma Crocker M.D.   On: 05/13/2015 09:30   Ct Cervical Spine Wo Contrast  05/13/2015   CLINICAL DATA:  Multiple recent falls. The patient denies neck pain.  EXAM: CT CERVICAL SPINE WITHOUT CONTRAST  TECHNIQUE: Multidetector CT imaging of the cervical spine was performed without intravenous contrast. Multiplanar CT image reconstructions were also generated.  COMPARISON:  CT head without contrast from the same day.  FINDINGS: Mild osteopenia is present. Leftward curvature of the cervical spine is present. Vertebral body heights are maintained. Slight degenerative anterolisthesis is present at C3-4.  The soft tissues the neck demonstrate atherosclerotic changes of the carotid arteries bilaterally denser calcifications are present on the left. The thyroid is somewhat heterogeneous. The dominant right posterior lesion measures at least 1.6 cm. Calcifications are present in the left lobe. The lung apices are clear.  Degenerative changes are present at C1-2 without significant stenosis.  C2-3: The facet joints are fused bilaterally. There is no significant stenosis.  C3-4: Asymmetric right-sided facet hypertrophy and uncovertebral disease results in moderate right foraminal stenosis. The central canal and left foramen are patent.  C4-5: Uncovertebral and facet disease leads to moderate left and mild right foraminal stenosis.  C5-6:  Asymmetric mild left foraminal narrowing at C5-6.  C6-7: Uncovertebral spurring leads to moderate left foraminal stenosis.  C7-T1:  Negative.  IMPRESSION: 1. Multilevel spondylosis of the cervical spine with mild leftward curvature. 2. No acute abnormality. 3. Atherosclerotic changes of the carotid bifurcations appear worse on the left.   Electronically Signed   By: San Morelle M.D.   On:  05/13/2015 11:27   Ct Hip Right Wo Contrast  05/13/2015   CLINICAL DATA:  Status post fall.  Right hip pain.  EXAM: CT OF THE RIGHT HIP WITHOUT CONTRAST  TECHNIQUE: Multidetector CT imaging of the right hip was performed according to the standard protocol. Multiplanar CT image reconstructions were also generated.  COMPARISON:  None.  FINDINGS: There is severe osteopenia. There is a comminuted right intertrochanteric fracture. There is no dislocation. There is severe osteoarthritis of the right hip with a bone-on-bone appearance and subchondral cystic changes. There are bulky marginal osteophytes.  There is a 2.5 cm hyperdense area in the subcutaneous fat overlying the right greater trochanter consistent with a hematoma. There is no other fluid collection or scratch at there is no focal fluid collection.  There is rib rectal fecal impaction.  There is peripheral vascular atherosclerotic disease.  IMPRESSION: 1. Comminuted right intertrochanteric fracture without dislocation. 2. Severe osteoarthritis of the right hip.   Electronically Signed   By: Kathreen Devoid   On: 05/13/2015 11:41   Dg Knee Complete 4 Views Right  05/13/2015   CLINICAL DATA:  Confusion.  Fell.  Swelling and pain.  EXAM: RIGHT KNEE - COMPLETE 4+ VIEW  COMPARISON:  None.  FINDINGS: The bones are osteopenic. No joint effusion. There is chronic calcification in the distal quadriceps tendon and possibly in the suprapatellar recess of the joint. No sign of acute fracture of the patella or extensor mechanism. There is chronic chondrocalcinosis and chronic vascular calcification. No acute bony injury.  IMPRESSION: No acute injury seen. Chronic appearing calcification in the quadriceps/patellar extensor mechanism region. No suspicion of acute fracture. No joint effusion. There could be small intra-articular loose bodies in the suprapatellar recess.   Electronically Signed   By: Nelson Chimes M.D.   On: 05/13/2015 10:01   Dg Hip Operative Unilat With  Pelvis Right  05/13/2015   CLINICAL DATA:  81 year old female with right hip fracture  EXAM: OPERATIVE RIGHT HIP (WITH PELVIS IF PERFORMED) 2 VIEWS  TECHNIQUE: Fluoroscopic spot image(s) were submitted for interpretation post-operatively.  FLUOROSCOPY TIME:  Radiation Exposure Index (as provided by the fluoroscopic device): Not provided  If the device does not provide the exposure index:  Fluoroscopy Time:  40 seconds  Number of Acquired Images:  3  COMPARISON:  Radiograph dated 05/13/2015  FINDINGS: Fluoroscopic images of the right hip demonstrate a right femoral intramedullary dynamic orthopedic hardware with intertrochanteric nail. No new fracture identified. There is osteoarthritic changes of the right hip joint with axial migration of the femur and narrowing of the joint space.  IMPRESSION: Right femoral intramedullary dynamic orthopedic hardware with intratrochanteric nail. No new fracture identified.  Osteoarthritic changes of the right hip.   Electronically Signed   By: Anner Crete M.D.   On: 05/13/2015 20:31   Dg Hip Unilat With Pelvis 2-3 Views Right  05/13/2015   CLINICAL DATA:  Recent fall with right hip pain, initial encounter  EXAM: RIGHT HIP (WITH PELVIS) 2-3 VIEWS  COMPARISON:  None.  FINDINGS: The pelvic ring is intact. Severe degenerative changes of the hip joints are noted bilaterally. There is lucency identified in the region of greater trochanter and extending inferiorly which is highly suspicious for undisplaced fracture. Cross-sectional imaging may be helpful for further evaluation. Diffuse aortic calcifications are again identified and stable.  IMPRESSION: Changes suspicious for intratrochanteric right femoral fracture.  Aortic calcifications with mild aneurysmal dilatation stable from the prior exam.   Electronically Signed   By: Inez Catalina M.D.   On: 05/13/2015 10:00      No results found for: HGBA1C Lab Results  Component Value Date   LDLCALC 80 04/04/2015   CREATININE  0.87 05/14/2015       Scheduled Meds: . aspirin EC  325 mg Oral Q breakfast  . brimonidine  1 drop Both Eyes BID  . cefTRIAXone (ROCEPHIN)  IV  1 g Intravenous Q24H  . cholecalciferol  1,000 Units Oral Daily  . docusate sodium  100 mg Oral BID  . donepezil  10 mg Oral QHS  . dorzolamide-timolol  1 drop Both Eyes BID  . feeding supplement (ENSURE ENLIVE)  237 mL Oral BID BM  . latanoprost  1 drop Both Eyes QHS  . memantine  28 mg Oral Daily  . rosuvastatin  10 mg Oral q1800   Continuous Infusions: . 0.9 % NaCl with KCl 40 mEq / L    . lactated ringers Stopped (05/13/15 2000)    Principal Problem:   Hip fracture, right Active Problems:   Vascular dementia with depressed mood   Hyperlipidemia   Osteoporosis  UTI (lower urinary tract infection)   Accelerated hypertension   Hip fracture    Time spent: 45 minutes   [facility] Pager [phone]. If 7PM-7AM, please contact night-coverage at [email or web address], password [email or web address] 05/14/2015, 11:26 AM  LOS: 1 day

## 2015-05-15 DIAGNOSIS — S72001D Fracture of unspecified part of neck of right femur, subsequent encounter for closed fracture with routine healing: Secondary | ICD-10-CM

## 2015-05-15 LAB — URINE CULTURE

## 2015-05-15 LAB — COMPREHENSIVE METABOLIC PANEL
ALT: 13 U/L — AB (ref 14–54)
AST: 26 U/L (ref 15–41)
Albumin: 2.2 g/dL — ABNORMAL LOW (ref 3.5–5.0)
Alkaline Phosphatase: 54 U/L (ref 38–126)
Anion gap: 9 (ref 5–15)
BUN: 23 mg/dL — ABNORMAL HIGH (ref 6–20)
CALCIUM: 8.8 mg/dL — AB (ref 8.9–10.3)
CO2: 28 mmol/L (ref 22–32)
CREATININE: 0.79 mg/dL (ref 0.44–1.00)
Chloride: 106 mmol/L (ref 101–111)
GFR calc Af Amer: >60 mL/min (ref >60)
Glucose, Bld: 90 mg/dL (ref 65–99)
Potassium: 4.4 mmol/L (ref 3.5–5.1)
SODIUM: 143 mmol/L (ref 135–145)
TOTAL PROTEIN: 5.2 g/dL — AB (ref 6.5–8.1)
Total Bilirubin: 0.5 mg/dL (ref 0.3–1.2)

## 2015-05-15 LAB — CBC
HEMATOCRIT: 33.7 % — AB (ref 36.0–46.0)
HEMOGLOBIN: 10.5 g/dL — AB (ref 12.0–15.0)
MCH: 30.4 pg (ref 26.0–34.0)
MCHC: 31.2 g/dL (ref 30.0–36.0)
MCV: 97.7 fL (ref 78.0–100.0)
Platelets: 134 10*3/uL — ABNORMAL LOW (ref 150–400)
RBC: 3.45 MIL/uL — ABNORMAL LOW (ref 3.87–5.11)
RDW: 14.5 % (ref 11.5–15.5)
WBC: 10.5 10*3/uL (ref 4.0–10.5)

## 2015-05-15 MED ORDER — CEPHALEXIN 500 MG PO CAPS: 500.0000 mg | ORAL_CAPSULE | Freq: Three times a day (TID) | ORAL | Status: DC

## 2015-05-15 MED ORDER — POLYETHYLENE GLYCOL 3350 17 G PO PACK: 17.0000 g | PACK | Freq: Every day | ORAL | Status: AC | PRN

## 2015-05-15 MED ORDER — CEPHALEXIN 500 MG PO CAPS
500.0000 mg | ORAL_CAPSULE | Freq: Three times a day (TID) | ORAL | Status: DC
  Administered 2015-05-15: 500 mg via ORAL
  Filled 2015-05-15: qty 1

## 2015-05-15 MED ORDER — BISACODYL 10 MG RE SUPP: 10.0000 mg | Freq: Every day | RECTAL | Status: AC | PRN

## 2015-05-15 MED ORDER — HYDROCODONE-ACETAMINOPHEN 5-325 MG PO TABS: 1.0000 | ORAL_TABLET | Freq: Four times a day (QID) | ORAL | Status: AC | PRN

## 2015-05-15 MED ORDER — ACETAMINOPHEN 500 MG PO TABS: 500.0000 mg | ORAL_TABLET | Freq: Four times a day (QID) | ORAL | Status: AC | PRN

## 2015-05-15 MED ORDER — ASPIRIN EC 325 MG PO TBEC: 325.0000 mg | DELAYED_RELEASE_TABLET | Freq: Every day | ORAL | Status: AC

## 2015-05-15 MED ORDER — DOCUSATE SODIUM 100 MG PO CAPS: 100.0000 mg | ORAL_CAPSULE | Freq: Two times a day (BID) | ORAL | Status: AC

## 2015-05-15 NOTE — Discharge Summary (Signed)
[facility]  Physician Discharge Summary   Patient ID: Brittney Graham MRN: [id number] DOB/AGE: [birth year] 81 y.o.  Admit date: 05/13/2015 Discharge date: 05/15/2015  PCP: REED, TIFFANY, DO  DISCHARGE DIAGNOSES:  Principal Problem:   Hip fracture, right Active Problems:   Vascular dementia with depressed mood   Hyperlipidemia   Osteoporosis   UTI (lower urinary tract infection)   Accelerated hypertension   Hip fracture   RECOMMENDATIONS FOR OUTPATIENT FOLLOW UP: Home health has been arranged Home oxygen has been ordered as well Incentive Spirometry  DISCHARGE CONDITION: fair  Diet recommendation: Regular  INITIAL HISTORY: 81 year old Caucasian female with a past medical history of hypertension, hyperlipidemia, dementia who lives in an assisted living facility and presented after a fall. This resulted in a right hip fracture. Patient was hospitalized for further management.  Consultations:  Dr. Lorin Mercy with orthopedics  Procedures: INTRAMEDULLARY (IM) NAIL INTERTROCHANTRIC (Right)  HOSPITAL COURSE:   Right hip fracture secondary to fall She status post surgical intervention. Seen by physical and occupational therapy. She will return to her assisted living facility which can provide a higher level of care that the patient needs.  Urinary tract infection with Escherichia coli Sensitivities have been reviewed. Change to oral Keflex.  History of vascular dementia with depressed mood At baseline. Continue home medications.  History of hyperlipidemia Continue Crestor  History of essential hypertension Blood pressure has been reasonably well controlled. Continue home medications.  Possibility of pulmonary nodules on chest x-ray. This has been discussed with the patient's family. They do not want any further investigations.  Overall, patient is stable. She has been cleared by orthopedics. Room air oxygen saturations to be checked. Incentive spirometry.  Okay to go back to assisted living facility this afternoon. She does have a significant history of smoking and likely has some element of COPD. She might need home oxygen.    PERTINENT LABS:  The results of significant diagnostics from this hospitalization (including imaging, microbiology, ancillary and laboratory) are listed below for reference.    Microbiology: Recent Results (from the past 240 hour(s))  Urine culture     Status: None   Collection Time: 05/13/15  8:39 AM  Result Value Ref Range Status   Specimen Description URINE, CATHETERIZED  Final   Special Requests NONE  Final   Culture >=100,000 COLONIES/mL ESCHERICHIA COLI  Final   Report Status 05/15/2015 FINAL  Final   Organism ID, Bacteria ESCHERICHIA COLI  Final      Susceptibility   Escherichia coli - MIC*    AMPICILLIN <=2 SENSITIVE Sensitive     CEFAZOLIN <=4 SENSITIVE Sensitive     CEFTRIAXONE <=1 SENSITIVE Sensitive     CIPROFLOXACIN <=0.25 SENSITIVE Sensitive     GENTAMICIN <=1 SENSITIVE Sensitive     IMIPENEM <=0.25 SENSITIVE Sensitive     NITROFURANTOIN <=16 SENSITIVE Sensitive     TRIMETH/SULFA <=20 SENSITIVE Sensitive     AMPICILLIN/SULBACTAM <=2 SENSITIVE Sensitive     PIP/TAZO <=4 SENSITIVE Sensitive     * >=100,000 COLONIES/mL ESCHERICHIA COLI  Surgical pcr screen     Status: None   Collection Time: 05/13/15  5:49 PM  Result Value Ref Range Status   MRSA, PCR NEGATIVE NEGATIVE Final   Staphylococcus aureus NEGATIVE NEGATIVE Final    Comment:        The Xpert SA Assay (FDA approved for NASAL specimens in patients over 70 years of age), is one component of a comprehensive surveillance program.  Test performance has been validated by  [facility] for patients greater than or equal to 39 year old. It is not intended to diagnose infection nor to guide or monitor treatment.      Labs: Basic Metabolic Panel:  Recent Labs Lab 05/13/15 0842 05/14/15 0825 05/15/15 0538  NA 142 137 143  K 3.1*  3.5 4.4  CL 104 105 106  CO2 29 27 28   GLUCOSE 96 95 90  BUN 9 17 23*  CREATININE 0.75 0.87 0.79  CALCIUM 9.1 7.9* 8.8*  MG  --  1.6*  --    Liver Function Tests:  Recent Labs Lab 05/15/15 0538  AST 26  ALT 13*  ALKPHOS 54  BILITOT 0.5  PROT 5.2*  ALBUMIN 2.2*   CBC:  Recent Labs Lab 05/13/15 0842 05/14/15 0825 05/15/15 0538  WBC 6.7 12.3* 10.5  NEUTROABS 5.3  --   --   HGB 14.2 10.0* 10.5*  HCT 43.1 31.1* 33.7*  MCV 94.9 95.4 97.7  PLT 188 158 134*   Cardiac Enzymes:  Recent Labs Lab 05/13/15 0842  TROPONINI 0.03    IMAGING STUDIES Dg Chest 1 View  05/13/2015   CLINICAL DATA:  [place].  Confusion.  EXAM: CHEST  1 VIEW  COMPARISON:  04/15/2010.  FINDINGS: Heart size is normal. There is calcification of the thoracic aorta. There is a hiatal hernia. No evidence of pneumothorax or hemothorax. There are some areas of indistinct density in both lungs that could represent scarring. Nodules are not excluded. Two-view chest radiography suggested when able. No acute bone finding.  IMPRESSION: No acute or traumatic finding.  Hiatal hernia.  Indistinct pulmonary densities that could represent scarring. Two-view chest radiography recommended when able to assess for the possibility of nodules.   Electronically Signed   By: Nelson Chimes M.D.   On: 05/13/2015 10:03   Ct Head Wo Contrast  05/13/2015   CLINICAL DATA:  Fall 2 times starting Saturday, right knee pain  EXAM: CT HEAD WITHOUT CONTRAST  TECHNIQUE: Contiguous axial images were obtained from the base of the skull through the vertex without intravenous contrast.  COMPARISON:  04/21/2010  FINDINGS: There is 7 mm high-density nodule in the roof of left maxillary sinus.  Atherosclerotic calcifications of carotid siphon. No intracranial hemorrhage, mass effect or midline shift. Small lacunar infarct is noted in right basal ganglia. Moderate cerebral atrophy. Periventricular and patchy subcortical white matter decreased attenuation  probable due to chronic small vessel ischemic changes. No definite acute cortical infarction. No mass lesion is noted on this unenhanced scan.  IMPRESSION: 1. No acute intracranial abnormality. Moderate cerebral atrophy. Periventricular and patchy subcortical white matter decreased attenuation probable due to chronic small vessel ischemic changes. There is probable partially calcified 7 mm nodule in the roof of left maxillary sinus. 2. No definite acute cortical infarction. Small lacunar infarct in right basal ganglia.   Electronically Signed   By: Lahoma Crocker M.D.   On: 05/13/2015 09:30   Ct Cervical Spine Wo Contrast  05/13/2015   CLINICAL DATA:  Multiple recent falls. The patient denies neck pain.  EXAM: CT CERVICAL SPINE WITHOUT CONTRAST  TECHNIQUE: Multidetector CT imaging of the cervical spine was performed without intravenous contrast. Multiplanar CT image reconstructions were also generated.  COMPARISON:  CT head without contrast from the same day.  FINDINGS: Mild osteopenia is present. Leftward curvature of the cervical spine is present. Vertebral body heights are maintained. Slight degenerative anterolisthesis is present at C3-4.  The soft tissues the neck demonstrate atherosclerotic changes of  the carotid arteries bilaterally denser calcifications are present on the left. The thyroid is somewhat heterogeneous. The dominant right posterior lesion measures at least 1.6 cm. Calcifications are present in the left lobe. The lung apices are clear.  Degenerative changes are present at C1-2 without significant stenosis.  C2-3: The facet joints are fused bilaterally. There is no significant stenosis.  C3-4: Asymmetric right-sided facet hypertrophy and uncovertebral disease results in moderate right foraminal stenosis. The central canal and left foramen are patent.  C4-5: Uncovertebral and facet disease leads to moderate left and mild right foraminal stenosis.  C5-6:  Asymmetric mild left foraminal narrowing at  C5-6.  C6-7: Uncovertebral spurring leads to moderate left foraminal stenosis.  C7-T1:  Negative.  IMPRESSION: 1. Multilevel spondylosis of the cervical spine with mild leftward curvature. 2. No acute abnormality. 3. Atherosclerotic changes of the carotid bifurcations appear worse on the left.   Electronically Signed   By: San Morelle M.D.   On: 05/13/2015 11:27   Ct Hip Right Wo Contrast  05/13/2015   CLINICAL DATA:  Status post fall.  Right hip pain.  EXAM: CT OF THE RIGHT HIP WITHOUT CONTRAST  TECHNIQUE: Multidetector CT imaging of the right hip was performed according to the standard protocol. Multiplanar CT image reconstructions were also generated.  COMPARISON:  None.  FINDINGS: There is severe osteopenia. There is a comminuted right intertrochanteric fracture. There is no dislocation. There is severe osteoarthritis of the right hip with a bone-on-bone appearance and subchondral cystic changes. There are bulky marginal osteophytes.  There is a 2.5 cm hyperdense area in the subcutaneous fat overlying the right greater trochanter consistent with a hematoma. There is no other fluid collection or scratch at there is no focal fluid collection.  There is rib rectal fecal impaction.  There is peripheral vascular atherosclerotic disease.  IMPRESSION: 1. Comminuted right intertrochanteric fracture without dislocation. 2. Severe osteoarthritis of the right hip.   Electronically Signed   By: Kathreen Devoid   On: 05/13/2015 11:41   Dg Knee Complete 4 Views Right  05/13/2015   CLINICAL DATA:  Confusion.  Fell.  Swelling and pain.  EXAM: RIGHT KNEE - COMPLETE 4+ VIEW  COMPARISON:  None.  FINDINGS: The bones are osteopenic. No joint effusion. There is chronic calcification in the distal quadriceps tendon and possibly in the suprapatellar recess of the joint. No sign of acute fracture of the patella or extensor mechanism. There is chronic chondrocalcinosis and chronic vascular calcification. No acute bony injury.   IMPRESSION: No acute injury seen. Chronic appearing calcification in the quadriceps/patellar extensor mechanism region. No suspicion of acute fracture. No joint effusion. There could be small intra-articular loose bodies in the suprapatellar recess.   Electronically Signed   By: Nelson Chimes M.D.   On: 05/13/2015 10:01   Dg Hip Operative Unilat With Pelvis Right  05/13/2015   CLINICAL DATA:  81 year old female with right hip fracture  EXAM: OPERATIVE RIGHT HIP (WITH PELVIS IF PERFORMED) 2 VIEWS  TECHNIQUE: Fluoroscopic spot image(s) were submitted for interpretation post-operatively.  FLUOROSCOPY TIME:  Radiation Exposure Index (as provided by the fluoroscopic device): Not provided  If the device does not provide the exposure index:  Fluoroscopy Time:  40 seconds  Number of Acquired Images:  3  COMPARISON:  Radiograph dated 05/13/2015  FINDINGS: Fluoroscopic images of the right hip demonstrate a right femoral intramedullary dynamic orthopedic hardware with intertrochanteric nail. No new fracture identified. There is osteoarthritic changes of the right hip joint with  axial migration of the femur and narrowing of the joint space.  IMPRESSION: Right femoral intramedullary dynamic orthopedic hardware with intratrochanteric nail. No new fracture identified.  Osteoarthritic changes of the right hip.   Electronically Signed   By: Anner Crete M.D.   On: 05/13/2015 20:31   Dg Hip Unilat With Pelvis 2-3 Views Right  05/13/2015   CLINICAL DATA:  Recent fall with right hip pain, initial encounter  EXAM: RIGHT HIP (WITH PELVIS) 2-3 VIEWS  COMPARISON:  None.  FINDINGS: The pelvic ring is intact. Severe degenerative changes of the hip joints are noted bilaterally. There is lucency identified in the region of greater trochanter and extending inferiorly which is highly suspicious for undisplaced fracture. Cross-sectional imaging may be helpful for further evaluation. Diffuse aortic calcifications are again identified and  stable.  IMPRESSION: Changes suspicious for intratrochanteric right femoral fracture.  Aortic calcifications with mild aneurysmal dilatation stable from the prior exam.   Electronically Signed   By: Inez Catalina M.D.   On: 05/13/2015 10:00    DISCHARGE EXAMINATION: Filed Vitals:   05/14/15 2014 05/15/15 0505 05/15/15 1057 05/15/15 1146  BP: 130/66 139/46  128/38  Pulse: 65 58  61  Temp: 98.4 F (36.9 C) 98.3 F (36.8 C)  97.8 F (36.6 C)  TempSrc:    Oral  Resp: 16 16  17   SpO2: 99% 100% 88% 88%   General appearance: alert, cooperative, appears stated age and no distress Resp: clear to auscultation bilaterally Cardio: regular rate and rhythm, S1, S2 normal, no murmur, click, rub or gallop GI: soft, non-tender; bowel sounds normal; no masses,  no organomegaly  DISPOSITION: Back to her assisted living facility with home health.  Discharge Instructions    Call MD for:  difficulty breathing, headache or visual disturbances    Complete by:  As directed      Call MD for:  extreme fatigue    Complete by:  As directed      Call MD for:  persistant dizziness or light-headedness    Complete by:  As directed      Call MD for:  persistant nausea and vomiting    Complete by:  As directed      Call MD for:  severe uncontrolled pain    Complete by:  As directed      Call MD for:  temperature >100.4    Complete by:  As directed      Discharge instructions    Complete by:  As directed   You were cared for by a hospitalist during your hospital stay. If you have any questions about your discharge medications or the care you received while you were in the hospital after you are discharged, you can call the unit and asked to speak with the hospitalist on call if the hospitalist that took care of you is not available. Once you are discharged, your primary care physician will handle any further medical issues. Please note that NO REFILLS for any discharge medications will be authorized once you are  discharged, as it is imperative that you return to your primary care physician (or establish a relationship with a primary care physician if you do not have one) for your aftercare needs so that they can reassess your need for medications and monitor your lab values. If you do not have a primary care physician, you can call [phone] for a physician referral.     Increase activity slowly    Complete by:  As  directed          ALLERGIES: No Known Allergies    Current Discharge Medication List    START taking these medications   Details  acetaminophen (TYLENOL) 500 MG tablet Take 1 tablet (500 mg total) by mouth every 6 (six) hours as needed for moderate pain. Qty: 60 tablet, Refills: 0    aspirin EC 325 MG tablet Take 1 tablet (325 mg total) by mouth daily. Qty: 30 tablet, Refills: 0    bisacodyl (DULCOLAX) 10 MG suppository Place 1 suppository (10 mg total) rectally daily as needed for moderate constipation. Qty: 12 suppository, Refills: 0    cephALEXin (KEFLEX) 500 MG capsule Take 1 capsule (500 mg total) by mouth every 8 (eight) hours. For 5 more days.    docusate sodium (COLACE) 100 MG capsule Take 1 capsule (100 mg total) by mouth 2 (two) times daily. Qty: 10 capsule, Refills: 0    HYDROcodone-acetaminophen (NORCO/VICODIN) 5-325 MG per tablet Take 1-2 tablets by mouth every 6 (six) hours as needed for moderate pain. Qty: 30 tablet, Refills: 0    polyethylene glycol (MIRALAX / GLYCOLAX) packet Take 17 g by mouth daily as needed for mild constipation. Qty: 14 each, Refills: 0      CONTINUE these medications which have NOT CHANGED   Details  bimatoprost (LUMIGAN) 0.03 % ophthalmic solution 1 drop at bedtime.    brimonidine (ALPHAGAN) 0.15 % ophthalmic solution 1 drop 2 (two) times daily.    cholecalciferol (VITAMIN D) 1000 UNITS tablet Take 1,000 Units by mouth daily.    CRESTOR 10 MG tablet TAKE 1 TABLET EVERY DAY FOR CHOLESTEROL Qty: 30 tablet, Refills: 1    donepezil  (ARICEPT) 10 MG tablet Take one tablet every day for memory  Qty: 30 tablet, Refills: 1    dorzolamide-timolol (COSOPT) 22.3-6.8 MG/ML ophthalmic solution 1 drop 2 (two) times daily.    ENSURE (ENSURE) Take 237 mLs by mouth 2 (two) times daily between meals.    Memantine HCl ER (NAMENDA XR) 28 MG CP24 Take 28 mg by mouth daily. Qty: 30 capsule, Refills: 3    mirtazapine (REMERON) 15 MG tablet Take 1 tablet (15 mg total) by mouth at bedtime. Qty: 30 tablet, Refills: 3      STOP taking these medications     aspirin 81 MG tablet        Follow-up Information    Schedule an appointment as soon as possible for a visit with Marybelle Killings, MD.   Specialty:  Orthopedic Surgery   Why:  need return office visit 2 weeks postop   Contact information:   [place] [phone]       TOTAL DISCHARGE TIME: 35 minutes  [facility] Pager [phone]  05/15/2015, 1:25 PM

## 2015-05-15 NOTE — Care Management (Signed)
Important Message  Patient Details  Name: [name] MRN: [id number] Date of Birth: December 26, 1923   Medicare Important Message Given:  Yes-second notification given    Louanne Belton 05/15/2015, 2:56 PM

## 2015-05-15 NOTE — Discharge Instructions (Signed)
Fall Prevention and Home Safety Falls cause injuries and can affect all age groups. It is possible to use preventive measures to significantly decrease the likelihood of falls. There are many simple measures which can make your home safer and prevent falls. OUTDOORS  Repair cracks and edges of walkways and driveways.  Remove high doorway thresholds.  Trim shrubbery on the main path into your home.  Have good outside lighting.  Clear walkways of tools, rocks, debris, and clutter.  Check that handrails are not broken and are securely fastened. Both sides of steps should have handrails.  Have leaves, snow, and ice cleared regularly.  Use sand or salt on walkways during winter months.  In the garage, clean up grease or oil spills. BATHROOM  Install night lights.  Install grab bars by the toilet and in the tub and shower.  Use non-skid mats or decals in the tub or shower.  Place a plastic non-slip stool in the shower to sit on, if needed.  Keep floors dry and clean up all water on the floor immediately.  Remove soap buildup in the tub or shower on a regular basis.  Secure bath mats with non-slip, double-sided rug tape.  Remove throw rugs and tripping hazards from the floors. BEDROOMS  Install night lights.  Make sure a bedside light is easy to reach.  Do not use oversized bedding.  Keep a telephone by your bedside.  Have a firm chair with side arms to use for getting dressed.  Remove throw rugs and tripping hazards from the floor. KITCHEN  Keep handles on pots and pans turned toward the center of the stove. Use back burners when possible.  Clean up spills quickly and allow time for drying.  Avoid walking on wet floors.  Avoid hot utensils and knives.  Position shelves so they are not too high or low.  Place commonly used objects within easy reach.  If necessary, use a sturdy step stool with a grab bar when reaching.  Keep electrical cables out of the  way.  Do not use floor polish or wax that makes floors slippery. If you must use wax, use non-skid floor wax.  Remove throw rugs and tripping hazards from the floor. STAIRWAYS  Never leave objects on stairs.  Place handrails on both sides of stairways and use them. Fix any loose handrails. Make sure handrails on both sides of the stairways are as long as the stairs.  Check carpeting to make sure it is firmly attached along stairs. Make repairs to worn or loose carpet promptly.  Avoid placing throw rugs at the top or bottom of stairways, or properly secure the rug with carpet tape to prevent slippage. Get rid of throw rugs, if possible.  Have an electrician put in a light switch at the top and bottom of the stairs. OTHER FALL PREVENTION TIPS  Wear low-heel or rubber-soled shoes that are supportive and fit well. Wear closed toe shoes.  When using a stepladder, make sure it is fully opened and both spreaders are firmly locked. Do not climb a closed stepladder.  Add color or contrast paint or tape to grab bars and handrails in your home. Place contrasting color strips on first and last steps.  Learn and use mobility aids as needed. Install an electrical emergency response system.  Turn on lights to avoid dark areas. Replace light bulbs that burn out immediately. Get light switches that glow.  Arrange furniture to create clear pathways. Keep furniture in the same place.  Firmly attach carpet with non-skid or double-sided tape.  Eliminate uneven floor surfaces.  Select a carpet pattern that does not visually hide the edge of steps.  Be aware of all pets. OTHER HOME SAFETY TIPS  Set the water temperature for 120 F (48.8 C).  Keep emergency numbers on or near the telephone.  Keep smoke detectors on every level of the home and near sleeping areas. Document Released: 10/23/2002 Document Revised: 05/03/2012 Document Reviewed: 01/22/2012 [facility] Patient Information 2015  [place], [place]. This information is not intended to replace advice given to you by your health care provider. Make sure you discuss any questions you have with your health care provider.  Hip Fracture, Open Reduction and Internal Fixation (ORIF) A hip fracture, or broken hip, can happen to anyone. To fix it, surgery is usually needed. One method is called open reduction and internal fixation, or ORIF for short. "Open reduction" means an incision (cut) is made to open the fracture area. This lets the surgeon see the broken bone. The bone pieces will be put back together. Some type of hardware will be used to hold the bones in place. That is called "internal fixation." Screws, pins, rods or a metal plate might be used. More than 250,000 people in the [place] break a hip every year. Nearly all of them are treated successfully with surgery. LET YOUR CAREGIVER KNOW ABOUT : On the day of your surgery, your caregivers will need to know the last time you had anything to eat or drink. This includes water, gum and candy. Also make sure they know about:   Any allergies.  All medications you are taking, including:  Herbs, eyedrops, over-the-counter medications and creams.  Blood thinners (anticoagulants), aspirin or other drugs that could affect blood clotting.  Use of steroids (by mouth or as creams).  Previous problems with anesthesia, including local anesthetics.  Possibility of pregnancy, if this applies.  Any history of blood clots.  Any history of bleeding or other blood problems.  Previous surgery.  Family history of anesthetic complications  Smoking history.  Any recent symptoms of colds or infections.  Other health problems. RISKS AND COMPLICATIONS  All operations have some risk. Being unhealthy increases risks. That is why you want to be as healthy as possible before this surgery. Possible problems after ORIF may include:  Blood clots.  Bleeding.  Infection near the  incision.  Lung infection (pneumonia).  Pain that continues after the operation.  Trouble walking. Some people may need to continue using a walker. BEFORE THE PROCEDURE You should be as healthy as possible before surgery for a broken hip. Sometimes this means waiting until other health problems are addressed. Then, the operation can be scheduled. To find out if you are ready for surgery:  A medical evaluation will be done. This examination will include checking your heart and lungs.  Imaging tests. These let the surgeon see what the fracture looks like. They could include:  X-rays to find exactly where the break is.  Computed tomography (CT) scan. A CT scan takes pictures using X-rays and a computer. This can give a better view of the broken hip.  Magnetic resonance imaging (MRI scan). It uses a magnet, radio waves and a computer. It may show a hidden fracture that cannot be seen on X-ray or CT.  Blood tests.  Urine test. It is possible to have a urinary tract infection and not know it.  Talking with an anesthesiologist. This is the person who will  be in charge of the anesthesia (medication to stop the pain) during the surgery. An ORIF procedure usually is done with general anesthesia (being asleep during surgery), or a spinal anesthesia is used to make you numb (no feeling) from the waist down but awake during the operation. Ask your surgeon if there is an advantage to one type of anesthetic over the other. You will need to stop taking certain medicines.  The admitting physician will have you stop using aspirin and non-steroidal anti-inflammatory drugs (NSAIDs) for pain relief. This includes prescription drugs and over-the-counter drugs such as ibuprofen and naproxen.  If you take blood-thinners, ask your healthcare provider when you should stop taking them. You will have to give what is called informed consent. This requires signing a legal paper that gives permission for the surgery.  To give informed consent:  You must understand how the procedure is done and why.  You must be told all the risks and benefits of the procedure.  You must sign the consent. Or, a legal guardian can do this.  Signing should be witnessed by a healthcare professional. The day before the surgery, eat only a light dinner. Then, do not eat or drink anything for at least 8 hours before the surgery. Ask if it is OK to take any needed medicines with a sip of water. PROCEDURE The preparation:  Small monitors will be put on your body. They are used to check your heart, blood pressure and oxygen level.  You will be given an intravenous line (IV). A needle will be inserted in your arm. It is hooked to a plastic tube. Medication will be able to flow directly into your body through the IV.  You will be given anesthesia.  For general anesthesia, the anesthesiologist may hold a mask gently over your face. You will breathe in gases that will make you sleep. A tube also might be put in your throat. This would let you continue to get anesthesia during the procedure.  For spinal anesthesia, a drug will be injected (shot) into the spinal cord area. This will make the body numb from the waist down.  The hip area will be scrubbed with a special solution to kill any germs.  The procedure:  Once you are asleep or numb, the surgeon will move the bones (realign the fracture) before any incisions are made. The goal is to get the bones back to their normal position.  X-rays may be taken. This is to check the position of the bones.  An incision is made over the hip. It will go through the muscles to the broken bone.  The bones will be put in place. Some type of hardware will be used to hold the bone together.  The hip is a ball-and-socket joint. The "ball" part of the joint is the very top of the upper leg bone (femur). Sometimes the very top of the upper leg bone is replaced with a man-made piece. If it is  replaced, this is called a partial hip replacement. Sometimes a complete or total hip replacement will be preformed, replacing both the ball and the socket. This is the preferred treatment if there is any appearance of arthritis in the hip joint.  The incision is closed with small stitches or staples.  A dressing (medicine and a bandage) is put over the incision.  An ORIF procedure can take several hours. AFTER THE PROCEDURE  You will stay in a recovery area until the anesthesia has worn off. Your  blood pressure and pulse will be checked every so often. Then you will be taken to a hospital room.  You may continue to get fluids through the IV for awhile.  Some pain is normal after an ORIF procedure. You will probably be given pain medicine. Be sure to tell your caregivers if the pain becomes severe.  It is important to be up and moving as soon as possible after an operation. Physical therapists will help you start walking. You will probably need to use a walker for a while. Follow the therapists instructions regarding weight bearing on the injured leg.  To prevent blood clots in your legs:  You may be given special stockings to wear.  You may need to take medicine to prevent clots.  Most people stay in the hospital for several days after this surgery.  Physical therapy is usually needed. Some people go to a rehabilitation center (a long-term care center or transitional care unit) before going home. Ask your healthcare providers what would be best for you. Often social workers are available to help you and your family make the best decision for you. HOME CARE INSTRUCTIONS   Medication.  Take any pain medicine that your surgeon suggests. Follow the directions carefully. Do not take over-the-counter painkillers unless the surgeon says it is OK. Medicine such as aspirin or ibuprofen can increase the chances of bleeding.  Your healthcare provider may prescribe a blood-thinner for several  weeks to 2 months. These drugs prevent blood clots.  Wound care.  Check the area around the incision carefully each day. Look for any redness or swelling. Also check for any fluid that is seeping from the incision. Tell your healthcare provider if you see anything.  Do not get the incision wet until your surgeon says it is OK.  Activity.  Most people will need the help of a walker or crutches for some time.  You will need to continue physical therapy once you are home. This often lasts for several months.  You will learn how to avoid putting stress on your hip while it heals if that is the direction given by your surgeon.  Be sure to do any exercises the therapist suggests. These exercises will help make your hip stronger.  Special equipment might make life at home easier. One example is a seat for the shower. Another is a raised toilet seat.  Ask your healthcare provider when you can resume other activities, such as work, driving or sex.  Follow-up care.  The surgeon may need to take out stitches or staples. This is usually done about two weeks after the operation.  The surgeon will do X-rays to check how your hip is healing. SEEK MEDICAL CARE IF:   You have any questions about medications.  You feel weak.  You are too tired to walk every day.  Pain continues, even after taking pain medicine.  You develop a fever of more than 100.5 F (38.1 C). SEEK IMMEDIATE MEDICAL CARE IF:   The incision becomes red or swollen. Or, it bleeds.  Your leg or foot becomes painful and swollen.  Your leg becomes pale or blue. It feels cold. It tingles or is numb.  You have trouble breathing.  You have chest pain.  You develop a fever of more than 102 F (38.9 C). Document Released: 10/21/2009 Document Revised: 01/25/2012 Document Reviewed: 10/21/2009 [facility] Patient Information 2015 [place], [place]. This information is not intended to replace advice given to you by your health  care provider. Make sure you discuss any questions you have with your health care provider.

## 2015-05-15 NOTE — Care Management Note (Signed)
Case Management Note  Patient Details  Name: Adalena Abdulla MRN: [id number] Date of Birth: 1924/07/03  Subjective/Objective:           Right hip fracture, s/p IM nailing         Action/Plan: Patient will be returning to [place] with [place], [place] and [place]. Spoke with Junie Panning at [facility], they set up the [facility] services, just need copy of [place] order included in the packet of information sent with the patient at discharge. Gave CSW copy of HH order and face to face to be placed in patient packett at discharge.      Expected Discharge Date:                  Expected Discharge Plan:  Assisted Living / Rest Home  In-House Referral:  Clinical Social Work  Discharge planning Services  CM Consult  Post Acute Care Choice:    Choice offered to:     DME Arranged:    DME Agency:     HH Arranged:  RN, PT, OT HH Agency:     Status of Service:     Medicare Important Message Given:    Date Medicare IM Given:    Medicare IM give by:    Date Additional Medicare IM Given:    Additional Medicare Important Message give by:     If discussed at [place] of Stay Meetings, dates discussed:    Additional Comments:  Nila Nephew, RN 05/15/2015, 12:26 PM

## 2015-05-15 NOTE — Progress Notes (Signed)
Orthopedic Tech Progress Note Patient Details:  Brittney Graham [facility] 03-31-1924 [id number]  Patient ID: Brittney Graham, female   DOB: 1924/11/15, 81 y.o.   MRN: [id number] Unable to use trapeze bar helper  Hildred Priest 05/15/2015, 7:23 AM

## 2015-05-15 NOTE — Discharge Planning (Signed)
Patient to discharge back to [facility]. Patient's son updated via CSW and medical team.  Facility: [facility] RN report number: [phone] Transportation: EMS ([address]  Brittney Graham, [place] - [place] [phone]) and Surgical [phone])

## 2015-05-15 NOTE — Progress Notes (Signed)
Report called to [facility] aware that pt has 3 gold colored rings and watch in denture cup has upper denture in another denture cup.tried to call son Marya Amsler to make him aware of the watch ,rings and upper dentures that was being sent in pt belongings bag he is aware.

## 2015-05-15 NOTE — Progress Notes (Signed)
Subjective: Doing well.  cna in room helping patient eat breakfast. Pain controlled.    Objective: Vital signs in last 24 hours: Temp:  [98 F (36.7 C)-98.4 F (36.9 C)] 98.3 F (36.8 C) (06/29 0505) Pulse Rate:  [58-84] 58 (06/29 0505) Resp:  [16] 16 (06/29 0505) BP: (124-139)/(46-96) 139/46 mmHg (06/29 0505) SpO2:  [99 %-100 %] 100 % (06/29 0505)  Intake/Output from previous day: 06/28 0701 - 06/29 0700 In: 240 [P.O.:240] Out: -  Intake/Output this shift: Total I/O In: 1775 [P.O.:100; I.V.:1625; IV Piggyback:50] Out: -    Recent Labs  05/13/15 0842 05/14/15 0825 05/15/15 0538  HGB 14.2 10.0* 10.5*    Recent Labs  05/14/15 0825 05/15/15 0538  WBC 12.3* 10.5  RBC 3.26* 3.45*  HCT 31.1* 33.7*  PLT 158 134*    Recent Labs  05/14/15 0825 05/15/15 0538  NA 137 143  K 3.5 4.4  CL 105 106  CO2 27 28  BUN 17 23*  CREATININE 0.87 0.79  GLUCOSE 95 90  CALCIUM 7.9* 8.8*   No results for input(s): LABPT, INR in the last 72 hours.  Exam:  Responds to commands.  Right hip and thigh wounds look good.  No drainage or signs of infection.  Staples intact.  Calf nontender.  NVI.   Assessment/Plan: Continue present care.  Stable from ortho standpoint.  Transfer to snf when medically stable and bed available.  Scripts for tylenol for pain and aspirin for DVT prophylaxis   Niccolo Burggraf M 05/15/2015, 7:49 AM

## 2015-05-24 DIAGNOSIS — S50912D Unspecified superficial injury of left forearm, subsequent encounter: Secondary | ICD-10-CM | POA: Diagnosis not present

## 2015-05-24 DIAGNOSIS — M80051D Age-related osteoporosis with current pathological fracture, right femur, subsequent encounter for fracture with routine healing: Secondary | ICD-10-CM | POA: Diagnosis not present

## 2015-05-24 DIAGNOSIS — S80922D Unspecified superficial injury of left lower leg, subsequent encounter: Secondary | ICD-10-CM | POA: Diagnosis not present

## 2015-05-24 DIAGNOSIS — L89312 Pressure ulcer of right buttock, stage 2: Secondary | ICD-10-CM | POA: Diagnosis not present

## 2015-05-26 ENCOUNTER — Emergency Department (HOSPITAL_COMMUNITY): Payer: Medicare PPO

## 2015-05-26 ENCOUNTER — Inpatient Hospital Stay (HOSPITAL_COMMUNITY): Payer: Medicare PPO

## 2015-05-26 ENCOUNTER — Inpatient Hospital Stay (HOSPITAL_COMMUNITY)
Admission: EM | Admit: 2015-05-26 | Discharge: 2015-06-17 | DRG: 208 | Disposition: E | Payer: Medicare PPO | Attending: Pulmonary Disease | Admitting: Pulmonary Disease

## 2015-05-26 DIAGNOSIS — D539 Nutritional anemia, unspecified: Secondary | ICD-10-CM | POA: Diagnosis present

## 2015-05-26 DIAGNOSIS — R4182 Altered mental status, unspecified: Secondary | ICD-10-CM

## 2015-05-26 DIAGNOSIS — N179 Acute kidney failure, unspecified: Secondary | ICD-10-CM | POA: Diagnosis present

## 2015-05-26 DIAGNOSIS — J9601 Acute respiratory failure with hypoxia: Secondary | ICD-10-CM | POA: Diagnosis present

## 2015-05-26 DIAGNOSIS — F17210 Nicotine dependence, cigarettes, uncomplicated: Secondary | ICD-10-CM | POA: Diagnosis present

## 2015-05-26 DIAGNOSIS — Z66 Do not resuscitate: Secondary | ICD-10-CM | POA: Diagnosis present

## 2015-05-26 DIAGNOSIS — I469 Cardiac arrest, cause unspecified: Secondary | ICD-10-CM | POA: Diagnosis not present

## 2015-05-26 DIAGNOSIS — R579 Shock, unspecified: Secondary | ICD-10-CM | POA: Diagnosis present

## 2015-05-26 DIAGNOSIS — Z79899 Other long term (current) drug therapy: Secondary | ICD-10-CM

## 2015-05-26 DIAGNOSIS — I739 Peripheral vascular disease, unspecified: Secondary | ICD-10-CM | POA: Diagnosis present

## 2015-05-26 DIAGNOSIS — H409 Unspecified glaucoma: Secondary | ICD-10-CM | POA: Diagnosis present

## 2015-05-26 DIAGNOSIS — R739 Hyperglycemia, unspecified: Secondary | ICD-10-CM | POA: Diagnosis present

## 2015-05-26 DIAGNOSIS — N390 Urinary tract infection, site not specified: Secondary | ICD-10-CM | POA: Diagnosis present

## 2015-05-26 DIAGNOSIS — F0150 Vascular dementia without behavioral disturbance: Secondary | ICD-10-CM | POA: Diagnosis present

## 2015-05-26 DIAGNOSIS — Z7982 Long term (current) use of aspirin: Secondary | ICD-10-CM | POA: Diagnosis not present

## 2015-05-26 DIAGNOSIS — J9600 Acute respiratory failure, unspecified whether with hypoxia or hypercapnia: Secondary | ICD-10-CM | POA: Diagnosis present

## 2015-05-26 DIAGNOSIS — E785 Hyperlipidemia, unspecified: Secondary | ICD-10-CM | POA: Diagnosis present

## 2015-05-26 DIAGNOSIS — R600 Localized edema: Secondary | ICD-10-CM

## 2015-05-26 DIAGNOSIS — I82401 Acute embolism and thrombosis of unspecified deep veins of right lower extremity: Secondary | ICD-10-CM | POA: Diagnosis present

## 2015-05-26 DIAGNOSIS — L03119 Cellulitis of unspecified part of limb: Secondary | ICD-10-CM | POA: Diagnosis present

## 2015-05-26 DIAGNOSIS — R109 Unspecified abdominal pain: Secondary | ICD-10-CM

## 2015-05-26 DIAGNOSIS — R54 Age-related physical debility: Secondary | ICD-10-CM | POA: Diagnosis present

## 2015-05-26 DIAGNOSIS — E872 Acidosis: Secondary | ICD-10-CM | POA: Diagnosis present

## 2015-05-26 DIAGNOSIS — K7200 Acute and subacute hepatic failure without coma: Secondary | ICD-10-CM | POA: Diagnosis present

## 2015-05-26 DIAGNOSIS — G9340 Encephalopathy, unspecified: Secondary | ICD-10-CM | POA: Diagnosis present

## 2015-05-26 DIAGNOSIS — Z515 Encounter for palliative care: Secondary | ICD-10-CM | POA: Diagnosis not present

## 2015-05-26 DIAGNOSIS — Z9911 Dependence on respirator [ventilator] status: Secondary | ICD-10-CM | POA: Diagnosis not present

## 2015-05-26 DIAGNOSIS — R627 Adult failure to thrive: Secondary | ICD-10-CM | POA: Diagnosis present

## 2015-05-26 DIAGNOSIS — I2699 Other pulmonary embolism without acute cor pulmonale: Principal | ICD-10-CM | POA: Diagnosis present

## 2015-05-26 DIAGNOSIS — F329 Major depressive disorder, single episode, unspecified: Secondary | ICD-10-CM | POA: Diagnosis present

## 2015-05-26 DIAGNOSIS — R1084 Generalized abdominal pain: Secondary | ICD-10-CM

## 2015-05-26 DIAGNOSIS — Z791 Long term (current) use of non-steroidal anti-inflammatories (NSAID): Secondary | ICD-10-CM

## 2015-05-26 LAB — I-STAT CG4 LACTIC ACID, ED: Lactic Acid, Venous: 10.98 mmol/L (ref 0.5–2.0)

## 2015-05-26 LAB — COMPREHENSIVE METABOLIC PANEL
ALT: 93 U/L — AB (ref 14–54)
AST: 199 U/L — ABNORMAL HIGH (ref 15–41)
Albumin: 1.9 g/dL — ABNORMAL LOW (ref 3.5–5.0)
Alkaline Phosphatase: 127 U/L — ABNORMAL HIGH (ref 38–126)
Anion gap: 18 — ABNORMAL HIGH (ref 5–15)
BUN: 49 mg/dL — ABNORMAL HIGH (ref 6–20)
CALCIUM: 8.2 mg/dL — AB (ref 8.9–10.3)
CO2: 14 mmol/L — AB (ref 22–32)
Chloride: 109 mmol/L (ref 101–111)
Creatinine, Ser: 2.11 mg/dL — ABNORMAL HIGH (ref 0.44–1.00)
GFR, EST AFRICAN AMERICAN: 23 mL/min — AB (ref >60)
GFR, EST NON AFRICAN AMERICAN: 20 mL/min — AB (ref >60)
GLUCOSE: 237 mg/dL — AB (ref 65–99)
Potassium: 4.4 mmol/L (ref 3.5–5.1)
SODIUM: 141 mmol/L (ref 135–145)
Total Bilirubin: 0.8 mg/dL (ref 0.3–1.2)
Total Protein: 5.3 g/dL — ABNORMAL LOW (ref 6.5–8.1)

## 2015-05-26 LAB — BASIC METABOLIC PANEL
Anion gap: 20 — ABNORMAL HIGH (ref 5–15)
BUN: 50 mg/dL — ABNORMAL HIGH (ref 6–20)
CO2: 15 mmol/L — ABNORMAL LOW (ref 22–32)
Calcium: 8.3 mg/dL — ABNORMAL LOW (ref 8.9–10.3)
Chloride: 108 mmol/L (ref 101–111)
Creatinine, Ser: 2.09 mg/dL — ABNORMAL HIGH (ref 0.44–1.00)
GFR calc non Af Amer: 20 mL/min — ABNORMAL LOW (ref >60)
GFR, EST AFRICAN AMERICAN: 23 mL/min — AB (ref >60)
Glucose, Bld: 226 mg/dL — ABNORMAL HIGH (ref 65–99)
Potassium: 4.3 mmol/L (ref 3.5–5.1)
SODIUM: 143 mmol/L (ref 135–145)

## 2015-05-26 LAB — CBC WITH DIFFERENTIAL/PLATELET
Basophils Absolute: 0 10*3/uL (ref 0.0–0.1)
Basophils Relative: 0 % (ref 0–1)
EOS ABS: 0 10*3/uL (ref 0.0–0.7)
Eosinophils Relative: 0 % (ref 0–5)
HEMATOCRIT: 31.2 % — AB (ref 36.0–46.0)
Hemoglobin: 9.3 g/dL — ABNORMAL LOW (ref 12.0–15.0)
Lymphocytes Relative: 4 % — ABNORMAL LOW (ref 12–46)
Lymphs Abs: 0.9 10*3/uL (ref 0.7–4.0)
MCH: 31.6 pg (ref 26.0–34.0)
MCHC: 29.8 g/dL — ABNORMAL LOW (ref 30.0–36.0)
MCV: 106.1 fL — AB (ref 78.0–100.0)
MONO ABS: 0.5 10*3/uL (ref 0.1–1.0)
MONOS PCT: 3 % (ref 3–12)
NEUTROS ABS: 18.9 10*3/uL — AB (ref 1.7–7.7)
Neutrophils Relative %: 93 % — ABNORMAL HIGH (ref 43–77)
Platelets: 263 10*3/uL (ref 150–400)
RBC: 2.94 MIL/uL — AB (ref 3.87–5.11)
RDW: 17.1 % — ABNORMAL HIGH (ref 11.5–15.5)
WBC: 20.3 10*3/uL — AB (ref 4.0–10.5)

## 2015-05-26 LAB — I-STAT ARTERIAL BLOOD GAS, ED
ACID-BASE DEFICIT: 10 mmol/L — AB (ref 0.0–2.0)
BICARBONATE: 16.4 meq/L — AB (ref 20.0–24.0)
O2 Saturation: 100 %
PCO2 ART: 30.1 mmHg — AB (ref 35.0–45.0)
TCO2: 17 mmol/L (ref 0–100)
pH, Arterial: 7.321 — ABNORMAL LOW (ref 7.350–7.450)
pO2, Arterial: 285 mmHg — ABNORMAL HIGH (ref 80.0–100.0)

## 2015-05-26 LAB — URINE MICROSCOPIC-ADD ON

## 2015-05-26 LAB — URINALYSIS, ROUTINE W REFLEX MICROSCOPIC
BILIRUBIN URINE: NEGATIVE
GLUCOSE, UA: NEGATIVE mg/dL
Ketones, ur: NEGATIVE mg/dL
Nitrite: NEGATIVE
Protein, ur: 30 mg/dL — AB
Specific Gravity, Urine: 1.02 (ref 1.005–1.030)
Urobilinogen, UA: 0.2 mg/dL (ref 0.0–1.0)
pH: 5 (ref 5.0–8.0)

## 2015-05-26 LAB — I-STAT CHEM 8, ED
BUN: 50 mg/dL — ABNORMAL HIGH (ref 6–20)
Calcium, Ion: 1.1 mmol/L — ABNORMAL LOW (ref 1.13–1.30)
Chloride: 110 mmol/L (ref 101–111)
Creatinine, Ser: 2.1 mg/dL — ABNORMAL HIGH (ref 0.44–1.00)
GLUCOSE: 240 mg/dL — AB (ref 65–99)
HCT: 30 % — ABNORMAL LOW (ref 36.0–46.0)
HEMOGLOBIN: 10.2 g/dL — AB (ref 12.0–15.0)
Potassium: 4.2 mmol/L (ref 3.5–5.1)
Sodium: 143 mmol/L (ref 135–145)
TCO2: 14 mmol/L (ref 0–100)

## 2015-05-26 LAB — LACTIC ACID, PLASMA: Lactic Acid, Venous: 7.4 mmol/L (ref 0.5–2.0)

## 2015-05-26 LAB — PROCALCITONIN: Procalcitonin: 0.24 ng/mL

## 2015-05-26 LAB — I-STAT TROPONIN, ED: TROPONIN I, POC: 0.06 ng/mL (ref 0.00–0.08)

## 2015-05-26 MED ORDER — FENTANYL CITRATE (PF) 100 MCG/2ML IJ SOLN
50.0000 ug | INTRAMUSCULAR | Status: DC | PRN
  Administered 2015-05-26: 50 ug via INTRAVENOUS
  Filled 2015-05-26: qty 2

## 2015-05-26 MED ORDER — SODIUM CHLORIDE 0.9 % IV BOLUS (SEPSIS)
1000.0000 mL | Freq: Once | INTRAVENOUS | Status: AC
  Administered 2015-05-26: 1000 mL via INTRAVENOUS

## 2015-05-26 MED ORDER — HEPARIN BOLUS VIA INFUSION
2000.0000 [IU] | Freq: Once | INTRAVENOUS | Status: AC
  Administered 2015-05-26: 2000 [IU] via INTRAVENOUS
  Filled 2015-05-26: qty 2000

## 2015-05-26 MED ORDER — DORZOLAMIDE HCL-TIMOLOL MAL 2-0.5 % OP SOLN
1.0000 [drp] | Freq: Two times a day (BID) | OPHTHALMIC | Status: DC
  Filled 2015-05-26: qty 10

## 2015-05-26 MED ORDER — LORAZEPAM 2 MG/ML IJ SOLN
5.0000 mg/h | INTRAVENOUS | Status: DC
  Administered 2015-05-26: 5 mg/h via INTRAVENOUS
  Filled 2015-05-26: qty 25

## 2015-05-26 MED ORDER — BRIMONIDINE TARTRATE 0.15 % OP SOLN: 1.0000 [drp] | Freq: Two times a day (BID) | OPHTHALMIC | Status: DC

## 2015-05-26 MED ORDER — VANCOMYCIN HCL IN DEXTROSE 750-5 MG/150ML-% IV SOLN
750.0000 mg | INTRAVENOUS | Status: DC
  Filled 2015-05-26: qty 150

## 2015-05-26 MED ORDER — FENTANYL CITRATE (PF) 100 MCG/2ML IJ SOLN: 100.0000 ug | INTRAMUSCULAR | Status: DC | PRN

## 2015-05-26 MED ORDER — PROPOFOL 1000 MG/100ML IV EMUL
INTRAVENOUS | Status: AC
  Administered 2015-05-26: 5 ug/kg/min via INTRAVENOUS
  Filled 2015-05-26: qty 100

## 2015-05-26 MED ORDER — INSULIN ASPART 100 UNIT/ML ~~LOC~~ SOLN: 0.0000 [IU] | SUBCUTANEOUS | Status: DC

## 2015-05-26 MED ORDER — PROPOFOL 1000 MG/100ML IV EMUL
5.0000 ug/kg/min | Freq: Once | INTRAVENOUS | Status: DC
  Administered 2015-05-26: 5 ug/kg/min via INTRAVENOUS

## 2015-05-26 MED ORDER — SODIUM CHLORIDE 0.9 % IV SOLN: 250.0000 mL | INTRAVENOUS | Status: DC | PRN

## 2015-05-26 MED ORDER — VANCOMYCIN HCL 500 MG IV SOLR: 500.0000 mg | INTRAVENOUS | Status: DC

## 2015-05-26 MED ORDER — PANTOPRAZOLE SODIUM 40 MG IV SOLR: 40.0000 mg | Freq: Every day | INTRAVENOUS | Status: DC

## 2015-05-26 MED ORDER — HEPARIN (PORCINE) IN NACL 100-0.45 UNIT/ML-% IJ SOLN
750.0000 [IU]/h | INTRAMUSCULAR | Status: DC
  Administered 2015-05-26: 750 [IU]/h via INTRAVENOUS
  Filled 2015-05-26: qty 250

## 2015-05-26 MED ORDER — MORPHINE SULFATE 25 MG/ML IV SOLN
10.0000 mg/h | INTRAVENOUS | Status: DC
  Administered 2015-05-26: 10 mg/h via INTRAVENOUS
  Filled 2015-05-26: qty 10

## 2015-05-26 MED ORDER — LORAZEPAM BOLUS VIA INFUSION
2.0000 mg | INTRAVENOUS | Status: DC | PRN
  Filled 2015-05-26: qty 5

## 2015-05-26 MED ORDER — DEXTROSE IN LACTATED RINGERS 5 % IV SOLN
INTRAVENOUS | Status: DC
  Administered 2015-05-26: 18:00:00 via INTRAVENOUS

## 2015-05-26 MED ORDER — BRIMONIDINE TARTRATE 0.2 % OP SOLN
1.0000 [drp] | Freq: Two times a day (BID) | OPHTHALMIC | Status: DC
  Filled 2015-05-26: qty 5

## 2015-05-26 MED ORDER — ROCURONIUM BROMIDE 50 MG/5ML IV SOLN
INTRAVENOUS | Status: AC | PRN
  Administered 2015-05-26: 50 mg via INTRAVENOUS

## 2015-05-26 MED ORDER — DOPAMINE-DEXTROSE 3.2-5 MG/ML-% IV SOLN
INTRAVENOUS | Status: AC
  Administered 2015-05-26: 7 ug/kg/min via INTRAVENOUS
  Filled 2015-05-26: qty 250

## 2015-05-26 MED ORDER — DOPAMINE-DEXTROSE 3.2-5 MG/ML-% IV SOLN
5.0000 ug/kg/min | INTRAVENOUS | Status: DC
Start: 2015-05-26 — End: 2015-05-26
  Administered 2015-05-26: 7 ug/kg/min via INTRAVENOUS

## 2015-05-26 MED ORDER — LATANOPROST 0.005 % OP SOLN
1.0000 [drp] | Freq: Every day | OPHTHALMIC | Status: DC
  Filled 2015-05-26: qty 2.5

## 2015-05-26 MED ORDER — MIDAZOLAM HCL 2 MG/2ML IJ SOLN: 2.0000 mg | INTRAMUSCULAR | Status: DC | PRN

## 2015-05-26 MED ORDER — PIPERACILLIN-TAZOBACTAM IN DEX 2-0.25 GM/50ML IV SOLN
2.2500 g | Freq: Three times a day (TID) | INTRAVENOUS | Status: DC
  Filled 2015-05-26 (×2): qty 50

## 2015-05-26 MED ORDER — PIPERACILLIN-TAZOBACTAM 3.375 G IVPB 30 MIN
3.3750 g | Freq: Once | INTRAVENOUS | Status: AC
Start: 2015-05-26 — End: 2015-05-26
  Administered 2015-05-26: 3.375 g via INTRAVENOUS
  Filled 2015-05-26: qty 50

## 2015-05-26 MED ORDER — DOPAMINE-DEXTROSE 3.2-5 MG/ML-% IV SOLN
5.0000 ug/kg/min | INTRAVENOUS | Status: DC
  Administered 2015-05-26: 10 ug/kg/min via INTRAVENOUS

## 2015-05-26 MED ORDER — MORPHINE BOLUS VIA INFUSION
5.0000 mg | INTRAVENOUS | Status: DC | PRN
  Filled 2015-05-26: qty 20

## 2015-05-26 MED ORDER — FENTANYL CITRATE (PF) 100 MCG/2ML IJ SOLN: 50.0000 ug | INTRAMUSCULAR | Status: DC | PRN

## 2015-05-26 MED ORDER — DEXTROSE 5 % IV SOLN: 1.0000 g | INTRAVENOUS | Status: DC

## 2015-05-26 MED ORDER — ETOMIDATE 2 MG/ML IV SOLN
INTRAVENOUS | Status: AC | PRN
  Administered 2015-05-26: 10 mg via INTRAVENOUS

## 2015-05-27 LAB — URINE CULTURE: Culture: NO GROWTH

## 2015-05-27 LAB — GLUCOSE, CAPILLARY: GLUCOSE-CAPILLARY: 95 mg/dL (ref 65–99)

## 2015-05-29 ENCOUNTER — Telehealth: Payer: Self-pay

## 2015-05-29 NOTE — Telephone Encounter (Signed)
On 05/29/2015 I received a death certificate from [employer] and [place]. The death certificate is for cremation. The patient is a patient of Doctor Simonds. The death certificate will be taken to Pulmonary Unit tomorrow am for Doctor Halford Chessman to sign the death certificate. On 2015-06-18 I received the death certificate from Doctor Sood. I got the death certificate ready for pickup. I called the funeral home to let them know the death certificate was ready and also faxed them a copy per their request.

## 2015-05-30 ENCOUNTER — Telehealth: Payer: Self-pay | Admitting: *Deleted

## 2015-05-30 NOTE — Telephone Encounter (Signed)
Received Medication profile and interaction from [place]. [phone].  Patient Deceased 05-30-13 Given to Dr. Mariea Clonts

## 2015-05-30 NOTE — Discharge Summary (Signed)
DEATH SUMMARY  DATE OF ADMISSION:  05/31/2015  DATE OF DISCHARGE/DEATH:  05-31-15  ADMISSION DIAGNOSES:   Recent ORIF R hip Acute respiratory failure Probable pulmonary embolism Circulatory shock - likely due to PE AKI Severe lactic acidosis Severe protein-calorie malnutrition Elevated LFTs - likely shock liver Severe RLE edema - likely DVT Macrocytic anemia Possible cellulitis Hyperglycemia Acute encephalopathy Vascular dementia   DISCHARGE DIAGNOSES:   Recent ORIF R hip Acute respiratory failure Probable pulmonary embolism Circulatory shock - likely due to PE AKI Severe lactic acidosis Severe protein-calorie malnutrition Elevated LFTs - likely shock liver Severe RLE edema - likely DVT Macrocytic anemia Possible cellulitis Hyperglycemia Acute encephalopathy Vascular dementia   PRESENTATION:   Pt was admitted with the following HPI and the above admission diagnoses:  This is a 81 year old frail female w/ significant h/o vascular dementia, just d/c'd to SNF on 6/29 s/p repair of right hip fracture. Per family has had what sounds like anorexia and cont FTT since her time at the SNF. EMS was called to the SNF on 2023-05-31 w/ what was reported at the pt c/o abd pain. On arrival however they found her to be cyanotic w/ agonal respirations. Her respirations were assisted w/ BVM while on way to ER. O2 sats were not able to be picked up. She was intubated for acute hypoxic respiratory failure. Further findings on initial eval demonstrated significant LLE swelling as well as cloudy colored urine. Her SBP was in the 60s not long after arrival. She was admitted to the Critical care service w/ working dx of acute respiratory failure in setting of what was most likely pulmonary embolism +/- UTI and LE cellulitis.  HOSPITAL COURSE:   This was very brief. After initial resuscitation efforts in the ED and after initiation of heparin, PCCM was contacted for admission. Due to the recent hip surgery  she was not deemed to be a candidate for thrombolytic therapy. Her unstable condition and acute kidney injury prohibited CTA of chest.  Dr Alva Garnet spoke with the pt's son re: goals of care and the following was documented:   Elderly woman with recent hip fx s/p ORIF presented to ED with acute resp distress, shock, abd pain. Intubated in ED. Has markedly swollen RLE with phlegmasia c/w DVT. This is almost certainly acute VTE. She was started on heparin gtt in ED. CTA chest cannot be performed due to elevated Cr. I have discussed with her son at length. He has discussed advanced directives with his mother in past. He believes that we might have already exceeded what she would have wanted for herself. He understands that her likelihood of surviving this catastrophic event in a way that would be considered a good outcome is vanishingly small. I directed that we would support her for now to allow him to notify other family members and loved ones. She will be DNR in event of cardiac arrest. We will make her comfort our highest priority. In addition to vent support and anticoagulation, low dose dopamine will be administered to maintain SBP > 90 mmHg. If not markedly better in the short term, he will wish to proceed with vent discontinuation and full comfort care. This might be as early as tomorrow AM  Later in the evening, the son requested discontinuation of vent support and full comfort care. This was undertaken and she passed away peacefully shortly thereafter    Cause of death:  Cardiopulmonary arrest likely due to massive PE  Contributing factors: Recent ORIF R hip  Vent dependent respiratory failure AKI  Merton Border, MD;  PCCM service; [place] [phone]

## 2015-05-31 LAB — CULTURE, BLOOD (ROUTINE X 2)
CULTURE: NO GROWTH
CULTURE: NO GROWTH

## 2015-06-17 NOTE — Progress Notes (Signed)
Transported pt to and from ED Trauma B to CT 2 on ventilator. Pt stable throughout with no complications. RT will continue to monitor.

## 2015-06-17 NOTE — ED Notes (Signed)
Dopamine rate changed to 8, locked out of MAR due to it being open in a pharmacist workstation.

## 2015-06-17 NOTE — Progress Notes (Signed)
ANTIBIOTIC CONSULT NOTE - INITIAL  Pharmacy Consult for ceftriaxone Indication: UTI  No Known Allergies  Patient Measurements:   Adjusted Body Weight:   Vital Signs: Temp: 90.7 F (32.6 C) (07/10 1451) BP: 99/43 mmHg (07/10 1451) Pulse Rate: 72 (07/10 1445) Intake/Output from previous day:   Intake/Output from this shift: Total I/O In: -  Out: 1000 [Urine:1000]  Labs:  Recent Labs  06-11-15 1323 Jun 11, 2015 1335  WBC 20.3*  --   HGB 9.3* 10.2*  PLT 263  --   CREATININE 2.11* 2.10*   CrCl cannot be calculated (Unknown ideal weight.). No results for input(s): VANCOTROUGH, VANCOPEAK, VANCORANDOM, GENTTROUGH, GENTPEAK, GENTRANDOM, TOBRATROUGH, TOBRAPEAK, TOBRARND, AMIKACINPEAK, AMIKACINTROU, AMIKACIN in the last 72 hours.   Microbiology: Recent Results (from the past 720 hour(s))  Urine culture     Status: None   Collection Time: 05/13/15  8:39 AM  Result Value Ref Range Status   Specimen Description URINE, CATHETERIZED  Final   Special Requests NONE  Final   Culture >=100,000 COLONIES/mL ESCHERICHIA COLI  Final   Report Status 05/15/2015 FINAL  Final   Organism ID, Bacteria ESCHERICHIA COLI  Final      Susceptibility   Escherichia coli - MIC*    AMPICILLIN <=2 SENSITIVE Sensitive     CEFAZOLIN <=4 SENSITIVE Sensitive     CEFTRIAXONE <=1 SENSITIVE Sensitive     CIPROFLOXACIN <=0.25 SENSITIVE Sensitive     GENTAMICIN <=1 SENSITIVE Sensitive     IMIPENEM <=0.25 SENSITIVE Sensitive     NITROFURANTOIN <=16 SENSITIVE Sensitive     TRIMETH/SULFA <=20 SENSITIVE Sensitive     AMPICILLIN/SULBACTAM <=2 SENSITIVE Sensitive     PIP/TAZO <=4 SENSITIVE Sensitive     * >=100,000 COLONIES/mL ESCHERICHIA COLI  Surgical pcr screen     Status: None   Collection Time: 05/13/15  5:49 PM  Result Value Ref Range Status   MRSA, PCR NEGATIVE NEGATIVE Final   Staphylococcus aureus NEGATIVE NEGATIVE Final    Comment:        The Xpert SA Assay (FDA approved for NASAL specimens in  patients over 79 years of age), is one component of a comprehensive surveillance program.  Test performance has been validated by [facility] for patients greater than or equal to 71 year old. It is not intended to diagnose infection nor to guide or monitor treatment.     Medical History: Past Medical History  Diagnosis Date  . Peripheral vascular disease, unspecified   . Unspecified glaucoma   . Vascular dementia with depressed mood   . Loss of weight   . Vitamin deficiency   . Hyperlipidemia   . Restless legs syndrome (RLS)   . Occlusion and stenosis of carotid artery without mention of cerebral infarction   . Osteoporosis     Medications:  Scheduled:  . brimonidine  1 drop Both Eyes BID  . dorzolamide-timolol  1 drop Both Eyes BID  . latanoprost  1 drop Both Eyes QHS  . pantoprazole (PROTONIX) IV  40 mg Intravenous QHS  . vancomycin  750 mg Intravenous STAT   Infusions:  . sodium chloride    . dextrose 5% lactated ringers    . DOPamine    . heparin 750 Units/hr (2015/06/11 1448)  . sodium chloride     Assessment: 81 yo female will be changed from vancomycin and zosyn to ceftriaxone for UTI.  Goal of Therapy:  Resolution of infection  Plan:  Ceftriaxone 1g iv q24h  Emillia Weatherly, Tsz-Yin 2015/06/11,3:24 PM

## 2015-06-17 NOTE — ED Notes (Signed)
Pt has received a total of 3 liters nacl

## 2015-06-17 NOTE — ED Notes (Signed)
To ED from [facility] for resp distress. EMS was called for abd pain, back pain, nausea. When EMS arrived pt was agonal. Resp assisted with BVM. Pt never lost pulse. On arrival pt not responding to verbal. Preparing to intubate.

## 2015-06-17 NOTE — H&P (Signed)
PULMONARY / CRITICAL CARE MEDICINE   Name: Brittney Graham MRN: [id number] DOB: 02-28-1924    ADMISSION DATE:  06/08/2015 CONSULTATION DATE:  7/10   REFERRING MD :  wofford   CHIEF COMPLAINT:  Acute hypoxic respiratory failure   INITIAL PRESENTATION:  81 year old female s/p recent right hip repair. Admitted 7/10 w/ working dx of probable RLE DVT/PE and shock.   STUDIES:  LE Venous US 7/10>>> ECHO 7/10>>>  SIGNIFICANT EVENTS: 6/29 discharged to SNF for rehab s/p right hip repair  7/10 admitted w/ prob RLE DVT/ PE and shock. Made DNR after discussing goals w/ family. Limitations to pressors made.    HISTORY OF PRESENT ILLNESS:  This is a 81 year old frail female w/ significant h/o vascular dementia, just d/c'd to SNF on 6/29 s/p repair of right hip fracture. Per family has had what sounds like anorexia and cont FTT since her time at the SNF. EMS was called to the SNF on 7/10 w/ what was reported at the pt c/o abd pain. On arrival however they found her to be cyanotic w/ agonal respirations. Her respirations were assisted w/ BVM while on way to ER. O2 sats were not able to be picked up. She was intubated for acute hypoxic respiratory failure. Further findings on initial eval demonstrated significant LLE swelling as well as cloudy colored urine. Her SBP was in the 60s not long after arrival. She was admitted to the Critical care service w/ working dx of acute respiratory failure in setting of what was most likely pulmonary embolism +/- UTI and LE cellulitis.    PAST MEDICAL HISTORY :   has a past medical history of Peripheral vascular disease, unspecified; Unspecified glaucoma; Vascular dementia with depressed mood; Loss of weight; Vitamin deficiency; Hyperlipidemia; Restless legs syndrome (RLS); Occlusion and stenosis of carotid artery without mention of cerebral infarction; and Osteoporosis.  has past surgical history that includes Appendectomy; Carotid endarterectomy (07/14/1968);  Colonoscopy (2008); and Intramedullary (im) nail intertrochanteric (Right, 05/13/2015). Prior to Admission medications   Medication Sig Start Date End Date Taking? Authorizing Provider  acetaminophen (TYLENOL) 500 MG tablet Take 1 tablet (500 mg total) by mouth every 6 (six) hours as needed for moderate pain. 05/15/15  Yes Lanae Crumbly, PA-C  aspirin EC 325 MG tablet Take 1 tablet (325 mg total) by mouth daily. 05/15/15  Yes Lanae Crumbly, PA-C  bimatoprost (LUMIGAN) 0.03 % ophthalmic solution Place 1 drop into both eyes at bedtime.    Yes Historical Provider, MD  cholecalciferol (VITAMIN D) 1000 UNITS tablet Take 1,000 Units by mouth daily.   Yes Historical Provider, MD  CRESTOR 10 MG tablet TAKE 1 TABLET EVERY DAY FOR CHOLESTEROL   Yes Tiffany L Reed, DO  donepezil (ARICEPT) 10 MG tablet Take one tablet every day for memory  04/16/14  Yes Tiffany L Reed, DO  dorzolamide-timolol (COSOPT) 22.3-6.8 MG/ML ophthalmic solution Place 1 drop into both eyes 2 (two) times daily.    Yes Historical Provider, MD  ENSURE (ENSURE) Take 237 mLs by mouth 2 (two) times daily between meals.   Yes Historical Provider, MD  HYDROcodone-acetaminophen (NORCO/VICODIN) 5-325 MG per tablet Take 1-2 tablets by mouth every 6 (six) hours as needed for moderate pain. 05/15/15  Yes Bonnielee Haff, MD  Memantine HCl ER (NAMENDA XR) 28 MG CP24 Take 28 mg by mouth daily. 05/17/14  Yes Tiffany L Reed, DO  mirtazapine (REMERON) 15 MG tablet Take 1 tablet (15 mg total) by mouth at bedtime.  05/17/14  Yes Tiffany L Reed, DO  bisacodyl (DULCOLAX) 10 MG suppository Place 1 suppository (10 mg total) rectally daily as needed for moderate constipation. 05/15/15   Bonnielee Haff, MD  brimonidine (ALPHAGAN) 0.15 % ophthalmic solution 1 drop 2 (two) times daily.    Historical Provider, MD  docusate sodium (COLACE) 100 MG capsule Take 1 capsule (100 mg total) by mouth 2 (two) times daily. 05/15/15   Bonnielee Haff, MD  polyethylene glycol (MIRALAX /  GLYCOLAX) packet Take 17 g by mouth daily as needed for mild constipation. 05/15/15   Bonnielee Haff, MD   No Known Allergies  FAMILY HISTORY:  indicated that her mother is deceased. She indicated that her father is deceased. She indicated that her sister is deceased. She indicated that her son is deceased.  SOCIAL HISTORY:  reports that she has been smoking Cigarettes.  She has a 100 pack-year smoking history. She does not have any smokeless tobacco history on file. She reports that she does not drink alcohol or use illicit drugs.  REVIEW OF SYSTEMS:  Unable   VITAL SIGNS: Temp:  [90.7 F (32.6 C)-93.6 F (34.2 C)] 90.7 F (32.6 C) (07/10 1451) Pulse Rate:  [72-98] 72 (07/10 1445) Resp:  [14-21] 18 (07/10 1451) BP: (76-131)/(35-72) 99/43 mmHg (07/10 1451) SpO2:  [87 %-100 %] 90 % (07/10 1445) FiO2 (%):  [100 %] 100 % (07/10 1259) Weight:  [46.267 kg (102 lb)] 46.267 kg (102 lb) (07/10 1525) HEMODYNAMICS:   VENTILATOR SETTINGS: Vent Mode:  [-] PRVC FiO2 (%):  [100 %] 100 % Set Rate:  [14 bmp] 14 bmp Vt Set:  [400 mL] 400 mL PEEP:  [5 cmH20] 5 cmH20 Plateau Pressure:  [23 cmH20] 23 cmH20 INTAKE / OUTPUT:  Intake/Output Summary (Last 24 hours) at 06-14-15 1527 Last data filed at 06/14/2015 1403  Gross per 24 hour  Intake      0 ml  Output   1000 ml  Net  -1000 ml    PHYSICAL EXAMINATION: General:  Frail 81 year old female, unresponsive on vent  Neuro:  GCS 5, still s/p NMB after intubation  HEENT:  + temporal wasting. No JVD orally intubated  Cardiovascular:  rrr Lungs:  Decreased  Abdomen:  Slightly distended  Skin:  Right LE 3-4+ edema. Weak 2+ pulses   LABS:  CBC  Recent Labs Lab 06/14/2015 1323 2015/06/14 1335  WBC 20.3*  --   HGB 9.3* 10.2*  HCT 31.2* 30.0*  PLT 263  --    Coag's No results for input(s): APTT, INR in the last 168 hours. BMET  Recent Labs Lab 06/14/2015 1323 06-14-2015 1335  NA 141 143  K 4.4 4.2  CL 109 110  CO2 14*  --   BUN 49* 50*   CREATININE 2.11* 2.10*  GLUCOSE 237* 240*   Electrolytes  Recent Labs Lab 14-Jun-2015 1323  CALCIUM 8.2*   Sepsis Markers  Recent Labs Lab 06/14/15 1336  LATICACIDVEN 10.98*   ABG No results for input(s): PHART, PCO2ART, PO2ART in the last 168 hours. Liver Enzymes  Recent Labs Lab 2015-06-14 1323  AST 199*  ALT 93*  ALKPHOS 127*  BILITOT 0.8  ALBUMIN 1.9*   Cardiac Enzymes No results for input(s): TROPONINI, PROBNP in the last 168 hours. Glucose No results for input(s): GLUCAP in the last 168 hours.  Imaging Ct Head Wo Contrast  2015-06-14   CLINICAL DATA:  Altered mental status.  EXAM: CT HEAD WITHOUT CONTRAST  TECHNIQUE: Contiguous axial images were  obtained from the base of the skull through the vertex without intravenous contrast.  COMPARISON:  CT scan of May 13, 2015.  FINDINGS: Bony calvarium appears intact. Moderate diffuse cortical atrophy is noted. Moderate chronic ischemic white matter disease is noted. No mass effect or midline shift is noted. Ventricular size is within normal limits. There is no evidence of mass lesion, hemorrhage or acute infarction.  IMPRESSION: Moderate diffuse cortical atrophy. Moderate chronic ischemic white matter disease. No acute intracranial abnormality seen.   Electronically Signed   By: Marijo Conception, M.D.   On: 06/03/2015 14:39   Dg Chest Portable 1 View  03-Jun-2015   CLINICAL DATA:  Endotracheal tube placement  EXAM: PORTABLE CHEST - 1 VIEW  COMPARISON:  05/13/2015  FINDINGS: Endotracheal tube tip 4.2 cm above the carina. Nasogastric tube projects over the central cardiac shadow, with tip thought to probably be in a hiatal hernia.  Loop recorder noted.  Retrocardiac density, likely hiatal hernia.  Low lung volumes are present, causing crowding of the pulmonary vasculature. Atherosclerotic aortic arch. Mild enlargement of the cardiopericardial silhouette.  Faint interstitial accentuation along the lingula.  IMPRESSION: 1. Endotracheal  tube well positioned. A nasogastric tube tip projects over the cardiac shadow and is thought to probably be within a moderate-sized hiatal hernia. 2. Indistinct interstitial accentuation in the lingula - early bronchopneumonia not excluded. 3. Low lung volumes are present, causing crowding of the pulmonary vasculature. 4. Mild enlargement of the cardiopericardial silhouette. 5. Atherosclerotic aortic arch.   Electronically Signed   By: Van Clines M.D.   On: 03-Jun-2015 14:11     ASSESSMENT / PLAN:  PULMONARY OETT 7/10>> A: Acute Hypoxic Respiratory Failure in setting of probable Pulmonary Emboli.  Basilar atx  P:   Full vent support  PAD protocol  F/u abg Am cxr Likely she will require terminal wean    CARDIOVASCULAR CVL left femoral CVL 7/10>>> A:  Circulatory shock (likely obstructive for PE) w/ cardiogenic compromise.  P:  ECHO (to eval RV) Dopamine 5-10 mcg/kg/min (will not titrate higher) Cont IVFs Tele  DNR status   RENAL A:   AKI  Severe lactic acidosis  P:   Cont resuscitation efforts MAP goal > 65 if able  Strict I&O Renal dose meds   GASTROINTESTINAL A:   Severe protein calorie malnutrition  Shock liver  P:   PPI for SUP Start tube feeds 7/11 if survives the night and family wishes to prolong support.   HEMATOLOGIC A:   Probable DVT/PE Macrocytic anemia  P:  Trend CBC on heparin gtt    INFECTIOUS A:   UTI Possible cellulitis LLE  P:   BCx2 7/10>>> UC 7/10>>> resp culture 7/10>>> Rocephin 7/10>>>  ENDOCRINE A:   hyperglycemia P:   ssi  NEUROLOGIC A:   Acute encephalopathy  H/o vascular dementia  P:   RASS goal: -2 Pad protocol   FAMILY  - Updates   - Inter-disciplinary family meet or Palliative Care meeting completed. DNR     TODAY'S SUMMARY:  prob PE/DVT. Care limitations set. Demented w/ FTT at baseline. Will support overnight. Probably looking at terminal wean in next 24 hrs. No escalation above current  outlined measures.   Erick Colace ACNP-BC [place] Pager # [phone] OR # [phone] if no answer   June 03, 2015, 3:27 PM   PCCM ATTENDING: I have evaluated the pt in conjunction with ACNP Kary Kos and reviewed pt's initial presentation and hospital database in detail.  The above  assessment and plan was formulated under my direction.  In summary: Elderly woman with recent hip fx s/p ORIF presented to ED with acute resp distress, shock, abd pain. Intubated in ED. Has markedly swollen RLE with phlegmasia c/w DVT. This is almost certainly acute VTE. She was started on heparin gtt in ED. CTA chest cannot be performed due to elevated Cr. I have discussed with her son at length. He has discussed advanced directives with his mother in past. He believes that we might have already exceeded what she would have wanted for herself. He understands that her likelihood of surviving this catastrophic event in a way that would be considered a good outcome is vanishingly small. I directed that we would support her for now to allow him to notify other family members and loved ones. She will be DNR in event of cardiac arrest. We will make her comfort our highest priority. In addition to vent support and anticoagulation, low dose dopamine will be administered to maintain SBP > 90 mmHg. If not markedly better in the short term, he will wish to proceed with vent discontinuation and full comfort care. This might be as early as tomorrow AM   45 minutes of independent CCM time was provided by me   Merton Border, MD;  PCCM service; Mobile [phone]

## 2015-06-17 NOTE — Progress Notes (Signed)
Patient expired at 21:24 on 04-Jun-2015.  This was verified by Rito Ehrlich, RN and Aldona Lento, RN, who listened with stethoscope to lung and heart sounds, which were not found.  Entire bag of Morphine and Ativan wasted in the sink witnessed by Rito Ehrlich, RN and Aldona Lento, RN.

## 2015-06-17 NOTE — ED Provider Notes (Signed)
CSN: [id number]     Arrival date & time 06/19/2015  1252 History   First MD Initiated Contact with Patient 06-19-2015 1330     Chief Complaint  Patient presents with  . Respiratory Arrest     (Consider location/radiation/quality/duration/timing/severity/associated sxs/prior Treatment) HPI Comments: 81 yo female presenting from her nursing facility secondary to abdominal pain and shortness of breath, then progressing to altered mental status.  She is unable to provide any history.  Level V caveat applies.   Patient is a 81 y.o. female presenting with altered mental status.  Altered Mental Status Presenting symptoms: unresponsiveness   Severity:  Severe Most recent episode:  Today Episode history:  Single Timing:  Constant Progression:  Worsening Chronicity:  New Context: nursing home resident   Context comment:  Recent right hip fracture.   Past Medical History  Diagnosis Date  . Peripheral vascular disease, unspecified   . Unspecified glaucoma   . Vascular dementia with depressed mood   . Loss of weight   . Vitamin deficiency   . Hyperlipidemia   . Restless legs syndrome (RLS)   . Occlusion and stenosis of carotid artery without mention of cerebral infarction   . Osteoporosis    Past Surgical History  Procedure Laterality Date  . Appendectomy    . Carotid endarterectomy  07/14/1968  . Colonoscopy  2008    Normal   . Intramedullary (im) nail intertrochanteric Right 05/13/2015    Procedure: INTRAMEDULLARY (IM) NAIL INTERTROCHANTRIC;  Surgeon: Marybelle Killings, MD;  Location: [facility];  Service: Orthopedics;  Laterality: Right;   No family history on file. History  Substance Use Topics  . Smoking status: Current Every Day Smoker -- 2.00 packs/day for 50 years    Types: Cigarettes  . Smokeless tobacco: Not on file  . Alcohol Use: No   OB History    No data available     Review of Systems  Unable to perform ROS     Allergies  Review of patient's allergies indicates no  known allergies.  Home Medications   Prior to Admission medications   Medication Sig Start Date End Date Taking? Authorizing Provider  acetaminophen (TYLENOL) 500 MG tablet Take 1 tablet (500 mg total) by mouth every 6 (six) hours as needed for moderate pain. 05/15/15  Yes Lanae Crumbly, PA-C  aspirin EC 325 MG tablet Take 1 tablet (325 mg total) by mouth daily. 05/15/15  Yes Lanae Crumbly, PA-C  bimatoprost (LUMIGAN) 0.03 % ophthalmic solution Place 1 drop into both eyes at bedtime.    Yes Historical Provider, MD  cholecalciferol (VITAMIN D) 1000 UNITS tablet Take 1,000 Units by mouth daily.   Yes Historical Provider, MD  CRESTOR 10 MG tablet TAKE 1 TABLET EVERY DAY FOR CHOLESTEROL   Yes Tiffany L Reed, DO  donepezil (ARICEPT) 10 MG tablet Take one tablet every day for memory  04/16/14  Yes Tiffany L Reed, DO  dorzolamide-timolol (COSOPT) 22.3-6.8 MG/ML ophthalmic solution Place 1 drop into both eyes 2 (two) times daily.    Yes Historical Provider, MD  ENSURE (ENSURE) Take 237 mLs by mouth 2 (two) times daily between meals.   Yes Historical Provider, MD  HYDROcodone-acetaminophen (NORCO/VICODIN) 5-325 MG per tablet Take 1-2 tablets by mouth every 6 (six) hours as needed for moderate pain. 05/15/15  Yes Bonnielee Haff, MD  Memantine HCl ER (NAMENDA XR) 28 MG CP24 Take 28 mg by mouth daily. 05/17/14  Yes Tiffany L Reed, DO  mirtazapine (REMERON) 15 MG  tablet Take 1 tablet (15 mg total) by mouth at bedtime. 05/17/14  Yes Tiffany L Reed, DO  bisacodyl (DULCOLAX) 10 MG suppository Place 1 suppository (10 mg total) rectally daily as needed for moderate constipation. 05/15/15   Bonnielee Haff, MD  brimonidine (ALPHAGAN) 0.15 % ophthalmic solution 1 drop 2 (two) times daily.    Historical Provider, MD  docusate sodium (COLACE) 100 MG capsule Take 1 capsule (100 mg total) by mouth 2 (two) times daily. 05/15/15   Bonnielee Haff, MD  polyethylene glycol (MIRALAX / GLYCOLAX) packet Take 17 g by mouth daily as needed  for mild constipation. 05/15/15   Bonnielee Haff, MD   BP 121/35 mmHg  Pulse 75  Temp(Src) 91 F (32.8 C)  Resp 14  SpO2 89% Physical Exam  Constitutional: She appears well-developed and well-nourished. She appears distressed.  frail  HENT:  Head: Normocephalic and atraumatic.  Mouth/Throat: Oropharynx is clear and moist.  Eyes: Conjunctivae are normal. Pupils are equal, round, and reactive to light. No scleral icterus.  Neck: Neck supple.  Cardiovascular: Normal rate, regular rhythm, normal heart sounds and intact distal pulses.   No murmur heard. Pulmonary/Chest: Effort normal and breath sounds normal. No stridor. Bradypnea (agonal) noted.  Abdominal: Soft. Bowel sounds are normal. She exhibits no distension.  Musculoskeletal: Normal range of motion. She exhibits edema (right leg with hip incision which appears clean, but with edema and mottling.).  Neurological: She is unresponsive.  Unresponsive.  Fixed gaze.    Skin: Skin is warm and dry. No rash noted.  Psychiatric: She has a normal mood and affect. Her behavior is normal.  Nursing note and vitals reviewed.   ED Course  CRITICAL CARE Performed by: Serita Grit Authorized by: Serita Grit Total critical care time: 45 minutes Critical care time was exclusive of separately billable procedures and treating other patients. Critical care was necessary to treat or prevent imminent or life-threatening deterioration of the following conditions: shock and respiratory failure. Critical care was time spent personally by me on the following activities: development of treatment plan with patient or surrogate, discussions with consultants, evaluation of patient's response to treatment, examination of patient, obtaining history from patient or surrogate, ordering and performing treatments and interventions, ordering and review of laboratory studies, ordering and review of radiographic studies, pulse oximetry, re-evaluation of patient's  condition and review of old charts.  INTUBATION Date/Time: Jun 10, 2015 3:48 PM Performed by: Serita Grit Authorized by: Serita Grit Consent: The procedure was performed in an emergent situation. Indications: respiratory distress and  airway protection Intubation method: direct Patient status: paralyzed (RSI) Preoxygenation: BVM Sedatives: etomidate Paralytic: rocuronium Laryngoscope size: Mac 3 Tube size: 7.5 mm Tube type: cuffed Number of attempts: 1 Cords visualized: yes Post-procedure assessment: chest rise and CO2 detector Breath sounds: equal and absent over the epigastrium Cuff inflated: yes ETT to lip: 20 cm Tube secured with: ETT holder Chest x-ray interpreted by me and radiologist. Chest x-ray findings: endotracheal tube in appropriate position Patient tolerance: Patient tolerated the procedure well with no immediate complications  CENTRAL LINE Date/Time: 2015/06/10 3:53 PM Performed by: Serita Grit Authorized by: Serita Grit Time out: Immediately prior to procedure a "time out" was called to verify the correct patient, procedure, equipment, support staff and site/side marked as required. Preparation: skin prepped with ChloraPrep Skin prep agent dried: skin prep agent completely dried prior to procedure Sterile barriers: all five maximum sterile barriers used - cap, mask, sterile gown, sterile gloves, and large sterile sheet Hand hygiene: hand  hygiene performed prior to central venous catheter insertion Location details: left femoral Catheter type: triple lumen Pre-procedure: landmarks identified Ultrasound guidance: yes Sterile ultrasound techniques: sterile gel and sterile probe covers were used Number of attempts: 1 Successful placement: yes Post-procedure: line sutured and dressing applied Assessment: blood return through all ports Patient tolerance: Patient tolerated the procedure well with no immediate complications   (including critical care  time)  EMERGENCY DEPARTMENT ULTRASOUND  Study: Limited Retroperitoneal Ultrasound of the Abdominal Aorta.  INDICATIONS:Hypotension, Abdominal pain and Age>55 Multiple views of the abdominal aorta were obtained in real-time from the diaphragmatic hiatus to the aortic bifurcation in transverse planes with a multi-frequency probe. PERFORMED BY: Myself IMAGES ARCHIVED?: Yes FINDINGS: Maximum aortic dimensions are <3cm LIMITATIONS:  Bowel gas and Abdominal pain, Emergent study INTERPRETATION:  No abdominal aortic aneurysm     Labs Review Labs Reviewed  CBC WITH DIFFERENTIAL/PLATELET - Abnormal; Notable for the following:    WBC 20.3 (*)    RBC 2.94 (*)    Hemoglobin 9.3 (*)    HCT 31.2 (*)    MCV 106.1 (*)    MCHC 29.8 (*)    RDW 17.1 (*)    Neutrophils Relative % 93 (*)    Neutro Abs 18.9 (*)    Lymphocytes Relative 4 (*)    All other components within normal limits  COMPREHENSIVE METABOLIC PANEL - Abnormal; Notable for the following:    CO2 14 (*)    Glucose, Bld 237 (*)    BUN 49 (*)    Creatinine, Ser 2.11 (*)    Calcium 8.2 (*)    Total Protein 5.3 (*)    Albumin 1.9 (*)    AST 199 (*)    ALT 93 (*)    Alkaline Phosphatase 127 (*)    GFR calc non Af Amer 20 (*)    GFR calc Af Amer 23 (*)    Anion gap 18 (*)    All other components within normal limits  URINALYSIS, ROUTINE W REFLEX MICROSCOPIC (NOT AT [facility]) - Abnormal; Notable for the following:    Color, Urine AMBER (*)    APPearance CLOUDY (*)    Hgb urine dipstick TRACE (*)    Protein, ur 30 (*)    Leukocytes, UA SMALL (*)    All other components within normal limits  I-STAT CHEM 8, ED - Abnormal; Notable for the following:    BUN 50 (*)    Creatinine, Ser 2.10 (*)    Glucose, Bld 240 (*)    Calcium, Ion 1.10 (*)    Hemoglobin 10.2 (*)    HCT 30.0 (*)    All other components within normal limits  I-STAT CG4 LACTIC ACID, ED - Abnormal; Notable for the following:    Lactic Acid, Venous 10.98 (*)    All  other components within normal limits  CULTURE, BLOOD (ROUTINE X 2)  CULTURE, BLOOD (ROUTINE X 2)  URINE CULTURE  URINE MICROSCOPIC-ADD ON  HEPARIN LEVEL (UNFRACTIONATED)  I-STAT TROPOININ, ED  POC OCCULT BLOOD, ED    Imaging Review Ct Head Wo Contrast  27-May-2015   CLINICAL DATA:  Altered mental status.  EXAM: CT HEAD WITHOUT CONTRAST  TECHNIQUE: Contiguous axial images were obtained from the base of the skull through the vertex without intravenous contrast.  COMPARISON:  CT scan of May 13, 2015.  FINDINGS: Bony calvarium appears intact. Moderate diffuse cortical atrophy is noted. Moderate chronic ischemic white matter disease is noted. No mass effect or midline shift  is noted. Ventricular size is within normal limits. There is no evidence of mass lesion, hemorrhage or acute infarction.  IMPRESSION: Moderate diffuse cortical atrophy. Moderate chronic ischemic white matter disease. No acute intracranial abnormality seen.   Electronically Signed   By: Marijo Conception, M.D.   On: 06-24-15 14:39   Dg Chest Portable 1 View  24-Jun-2015   CLINICAL DATA:  Endotracheal tube placement  EXAM: PORTABLE CHEST - 1 VIEW  COMPARISON:  05/13/2015  FINDINGS: Endotracheal tube tip 4.2 cm above the carina. Nasogastric tube projects over the central cardiac shadow, with tip thought to probably be in a hiatal hernia.  Loop recorder noted.  Retrocardiac density, likely hiatal hernia.  Low lung volumes are present, causing crowding of the pulmonary vasculature. Atherosclerotic aortic arch. Mild enlargement of the cardiopericardial silhouette.  Faint interstitial accentuation along the lingula.  IMPRESSION: 1. Endotracheal tube well positioned. A nasogastric tube tip projects over the cardiac shadow and is thought to probably be within a moderate-sized hiatal hernia. 2. Indistinct interstitial accentuation in the lingula - early bronchopneumonia not excluded. 3. Low lung volumes are present, causing crowding of the  pulmonary vasculature. 4. Mild enlargement of the cardiopericardial silhouette. 5. Atherosclerotic aortic arch.   Electronically Signed   By: Van Clines M.D.   On: 2015-06-24 14:11     EKG Interpretation   Date/Time:  06-24-2015 12:55:41 EDT Ventricular Rate:  84 PR Interval:  152 QRS Duration: 105 QT Interval:  413 QTC Calculation: 488 R Axis:   -26 Text Interpretation:  Sinus rhythm Left atrial enlargement Left  ventricular hypertrophy Anterior Q waves, possibly due to LVH No  significant change was found Confirmed by [facility]  MD, TREY ([id number]) on  06-24-15 2:24:31 PM      MDM   Final diagnoses:  Altered mental status  Acute respiratory failure with hypoxia    81 yo female with altered mental status and respiratory arrest.  I suspect her symptoms are secondary to PE based on her recent hip surgery and her swollen right leg.  Heparin started empirically.  She required intubation, central line placement, and ICU admission.    Serita Grit, MD 06/24/15 [phone]

## 2015-06-17 NOTE — Progress Notes (Signed)
   May 27, 2015 1900  Clinical Encounter Type  Visited With Patient and family together  Visit Type Patient actively dying  Referral From Nurse  Consult/Referral To Chaplain  Spiritual Encounters  Spiritual Needs Emotional;Grief support;Prayer  Stress Factors  Family Stress Factors Loss  Ch called to be with family prior to extubation of PT; PT recently DNR; Family preparing for death as PT is actively dying, Grief and spiritual support

## 2015-06-17 NOTE — Progress Notes (Signed)
Pt transported from ER to ICU on vent.  Tolerated well . RT will continue to monitor.

## 2015-06-17 NOTE — Progress Notes (Signed)
eLink Physician-Brief Progress Note Patient Name: Brittney Graham DOB: [birth year] MRN: [id number]   Date of Service  06/03/2015  HPI/Events of Note  Spoke with son, Marya Amsler [facility]). Requested terminal one-way extubation and comfort care measures  eICU Interventions  Orders place.       Intervention Category Intermediate Interventions: Other:  Frederich Montilla June 03, 2015, 6:56 PM

## 2015-06-17 NOTE — Procedures (Signed)
Extubation Procedure Note  Patient Details:   Name: Brittney Graham DOB: [birth year] MRN: [id number]   Airway Documentation:  Airway 7.5 mm (Active)  Secured at (cm) 20 cm 06-03-2015  5:11 PM  Measured From Lips 06-03-15  5:11 PM  Secured Location Right 06/03/15  5:11 PM  Secured By [employer] 06-03-2015  5:11 PM  Site Condition Dry 2015-06-03 12:59 PM    Evaluation  O2 sats: transiently fell during during procedure Complications: No apparent complications Patient did tolerate procedure well. Bilateral Breath Sounds: Clear, Diminished Suctioning: Oral, Airway  Palliative extubation completed per MD order and family wishes.  RN and family at bedside.  [name] 06/03/15, 9:11 PM

## 2015-06-17 NOTE — Progress Notes (Signed)
eLink Physician-Brief Progress Note Patient Name: Brittney Graham DOB: 10/27/1924 MRN: [id number]   Date of Service  2015-06-12  HPI/Events of Note  Spoke with HCPOA (son - [name]),who stated that he will likely withdraw care tonight, given poor prognosis and poor clinical condition  eICU Interventions  Verbal understanding, discussed withdrawal of care measures, and comfort care. Son will back final decision.      Intervention Category Intermediate Interventions: Other:  Trek Kimball June 12, 2015, 6:41 PM

## 2015-06-17 NOTE — ED Notes (Signed)
Setting up for Central Line placement by Dr Doy Mince in left femoral

## 2015-06-17 NOTE — Procedures (Signed)
Intubation Procedure Note Brittney Graham [id number] [birth year]  Procedure: Intubation Indications: Respiratory insufficiency  Procedure Details Consent: Unable to obtain consent because of emergent medical necessity. Time Out: Verified patient identification, verified procedure, site/side was marked, verified correct patient position, special equipment/implants available, medications/allergies/relevent history reviewed, required imaging and test results available.  Performed  Maximum sterile technique was used including gloves, gown, hand hygiene and mask.  MAC and 3    Evaluation Hemodynamic Status: Transient hypotension treated with fluid; O2 sats: stable throughout Patient's Current Condition: unstable Complications: No apparent complications Patient did tolerate procedure well. Chest X-ray ordered to verify placement.  CXR: pending.  Pt intubated for respiratory insufficiency. Pt intubated using Mac 3 on 1st attempt with 7.5 ett secured at 20 at the lip with commercial tube holder. Pt has bilateral BS, positive color change on etco2, CXR pending.    Jesse Sans 2015-06-03

## 2015-06-17 NOTE — ED Notes (Signed)
Intubated without incident. 7.5 tube tapped 21 at lip

## 2015-06-17 NOTE — Progress Notes (Signed)
ANTICOAGULATION CONSULT NOTE - Initial Consult  Pharmacy Consult for Heparin Indication: pulmonary embolus  No Known Allergies  Patient Measurements:    Wt: 46.4 kg Ht: 50 inches Heparin Dosing Weight: 46.4 kg  Vital Signs: BP: 76/49 mmHg (07/10 1306) Pulse Rate: 96 (07/10 1312)  Labs:  Recent Labs  22-Jun-2015 1323 22-Jun-2015 1335  HGB 9.3* 10.2*  HCT 31.2* 30.0*  PLT 263  --   CREATININE  --  2.10*    CrCl cannot be calculated (Unknown ideal weight.).   Medical History: Past Medical History  Diagnosis Date  . Peripheral vascular disease, unspecified   . Unspecified glaucoma   . Vascular dementia with depressed mood   . Loss of weight   . Vitamin deficiency   . Hyperlipidemia   . Restless legs syndrome (RLS)   . Occlusion and stenosis of carotid artery without mention of cerebral infarction   . Osteoporosis     Assessment: 72 YOF who resides at an ALF who originally presented on 6/27 after a fall and was found to have a hip fracture. The patient underwent surgery for placement of a R-IM nail and was only placed on ASA 325 mg daily post-op. The patient represented to the [facility] on 7/10 with SOB, abd pain and was found to have an acute PE. Pharmacy was consulted to start heparin for anticoagulation. Hgb 9.3, plts 263. Hep Wt: 46.4 kg  Pharmacy also consulted to start Vancomycin + Zosyn for r/o sepsis.   Goal of Therapy:  Heparin level 0.3-0.7 units/ml Monitor platelets by anticoagulation protocol: Yes   Plan:  1. Heparin bolus of 2000 units x 1 2. Initiate heparin at a drip rate of 750 units/hr (7.5 ml/hr) 3. Vancomycin 750 mg x 1 followed by 500 mg IV every 48 hours 4. Zosyn 3.375g IV x 1 dose now followed by 2.25g IV every 8 hours 5. Will continue to monitor for any signs/symptoms of bleeding and will follow up with heparin level in 8 hours  6. Will continue to follow renal function, culture results, LOT, and antibiotic de-escalation plans   Alycia Rossetti,  PharmD, BCPS Clinical Pharmacist Pager: [phone] 06-22-2015 1:58 PM

## 2015-06-17 DEATH — deceased

## 2015-10-03 ENCOUNTER — Ambulatory Visit: Payer: Medicare PPO | Admitting: Internal Medicine

## 2016-11-15 IMAGING — CT CT HEAD W/O CM
1 of 2 series · 15 of 30 positions shown, 19 images · non-contrast
Comparison: 04/21/2010

CLINICAL DATA: Fall 2 times starting [REDACTED], right knee pain

EXAM:
CT HEAD WITHOUT CONTRAST
TECHNIQUE: Contiguous axial images were obtained from the base of the skull
through the vertex without intravenous contrast.

[Series 6: head 2.0 h70h · axial · 0.44mm/px · z∈[-117,+27]mm · 15 of 81 slices shown, 19 images]
[im 5/81  brain]
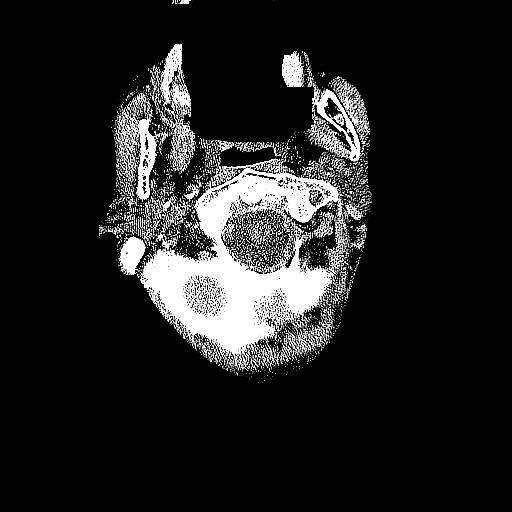
[im 5/81  bone]
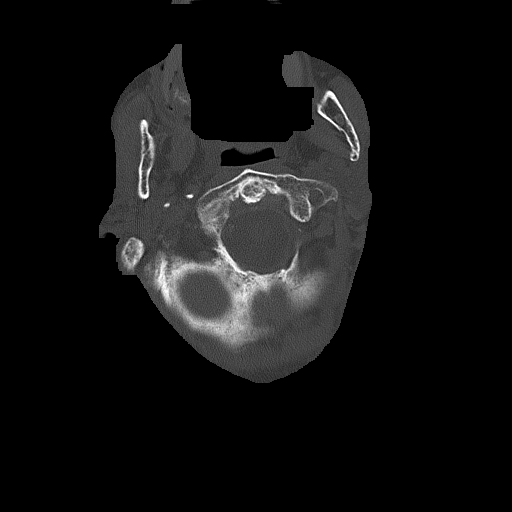
[im 9/81  brain]
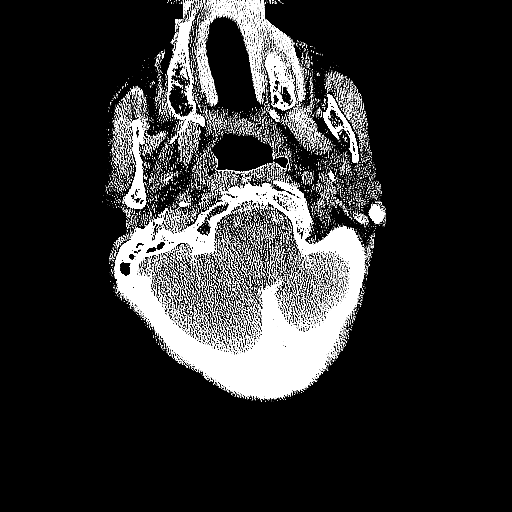
[im 17/81  brain]
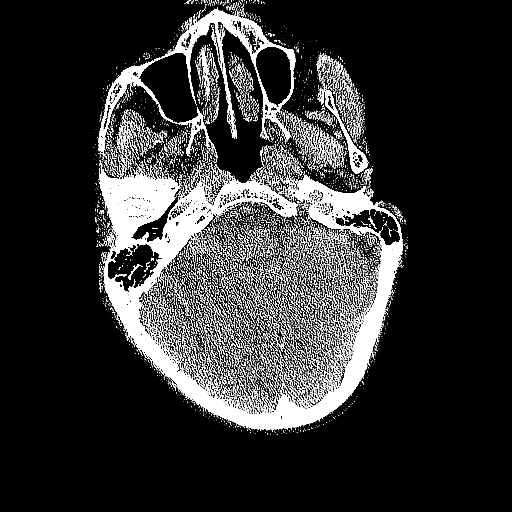
[im 21/81  brain]
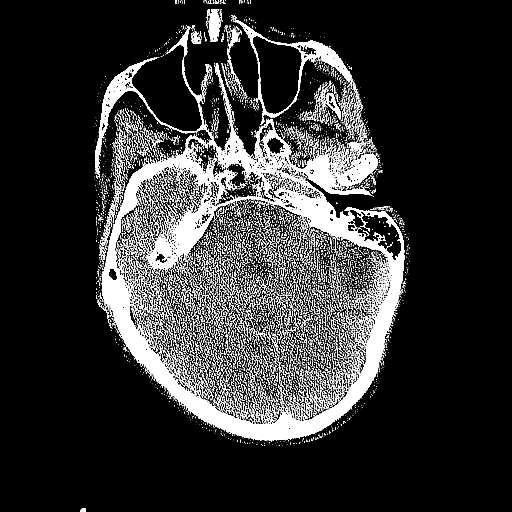
[im 25/81  brain]
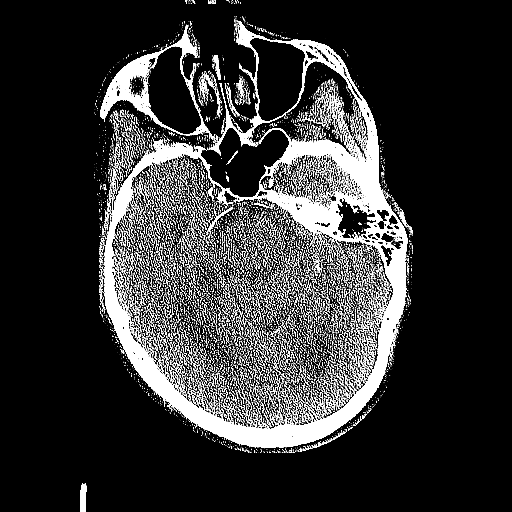
[im 25/81  bone]
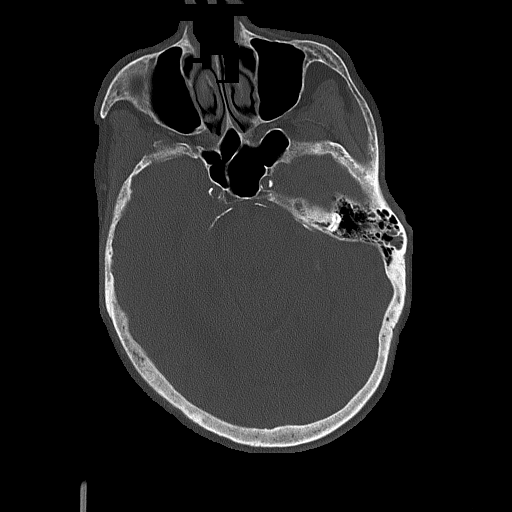
[im 29/81  brain]
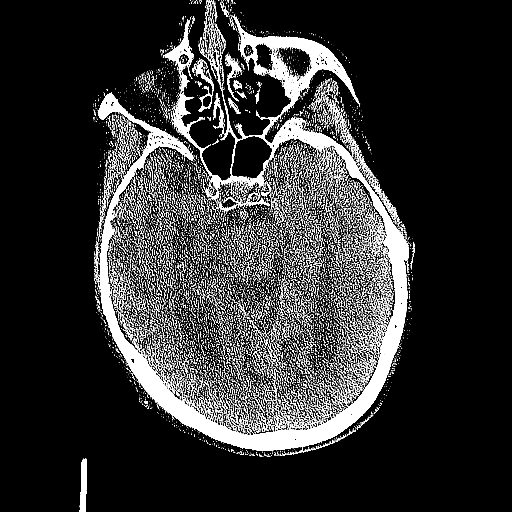
[im 37/81  brain]
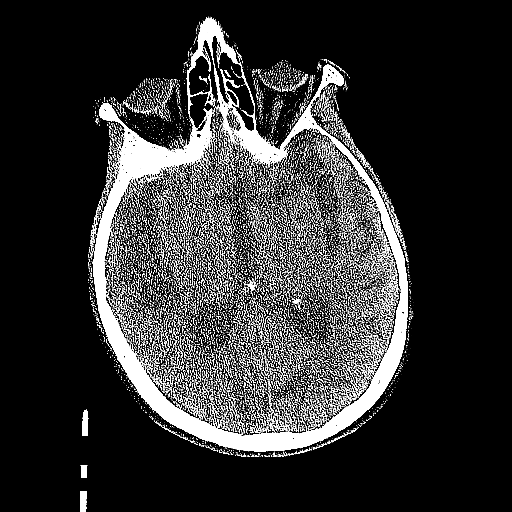
[im 41/81  brain]
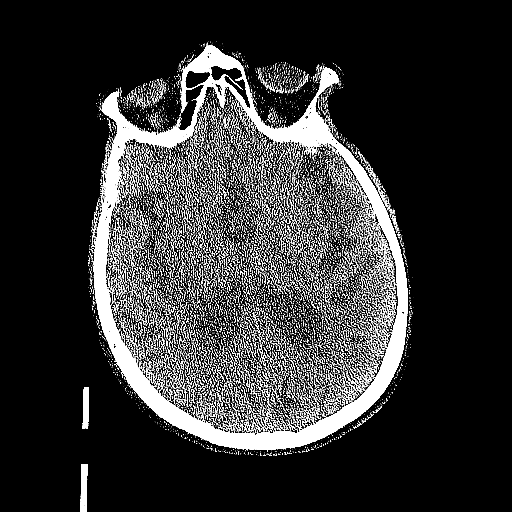
[im 45/81  brain]
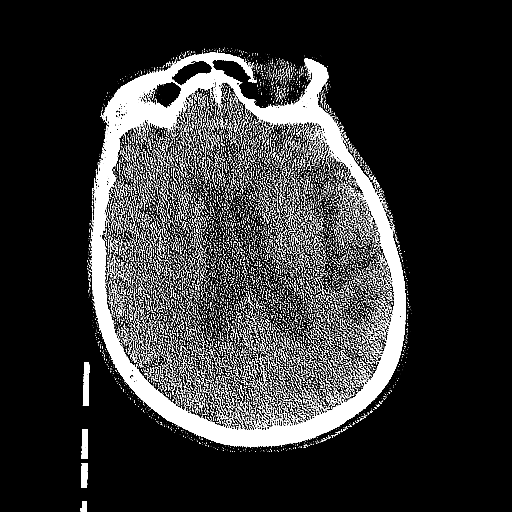
[im 45/81  bone]
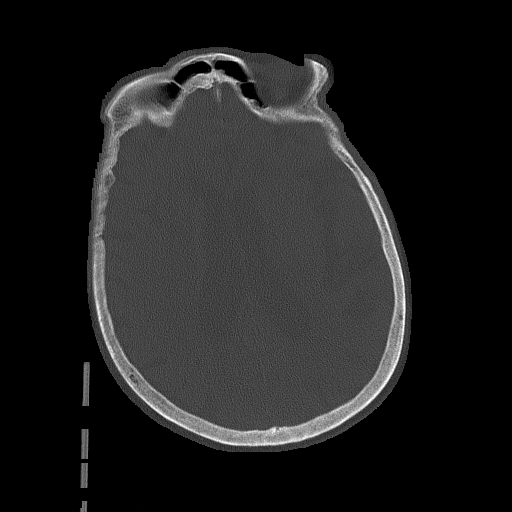
[im 53/81  brain]
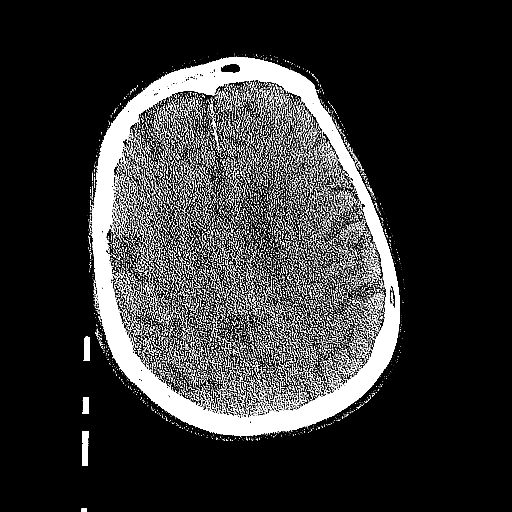
[im 57/81  brain]
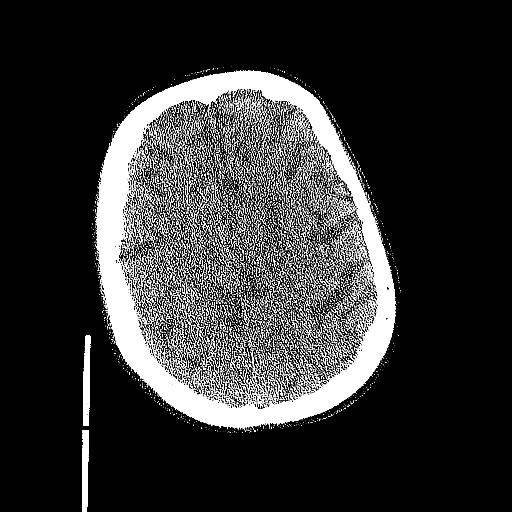
[im 61/81  brain]
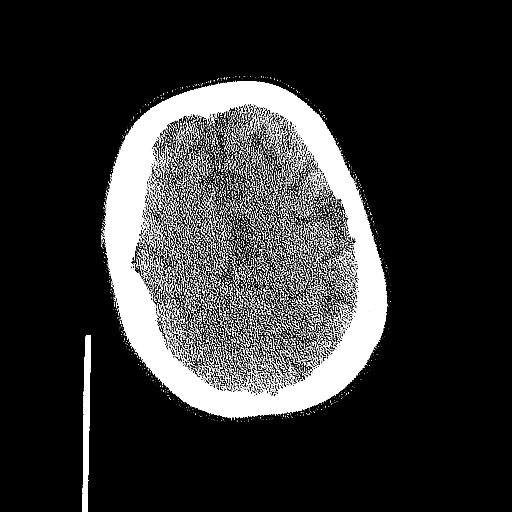
[im 65/81  brain]
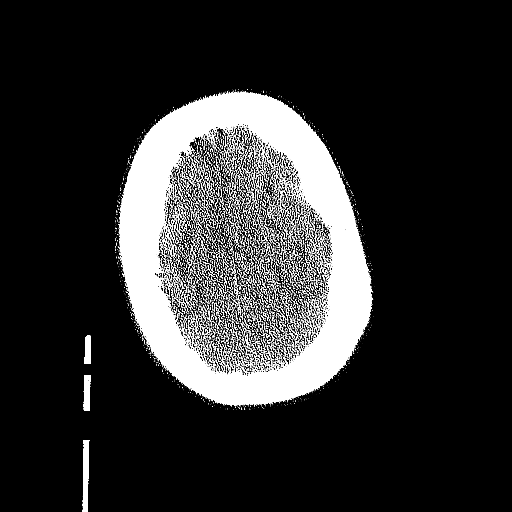
[im 65/81  bone]
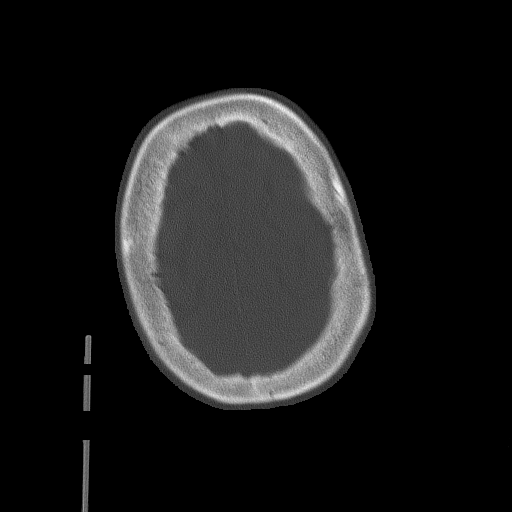
[im 73/81  brain]
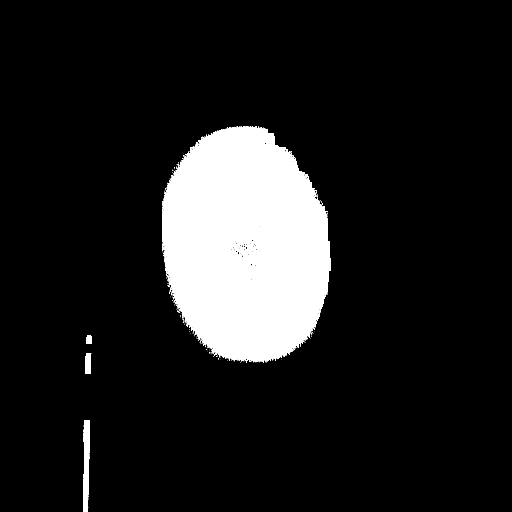
[im 77/81  brain]
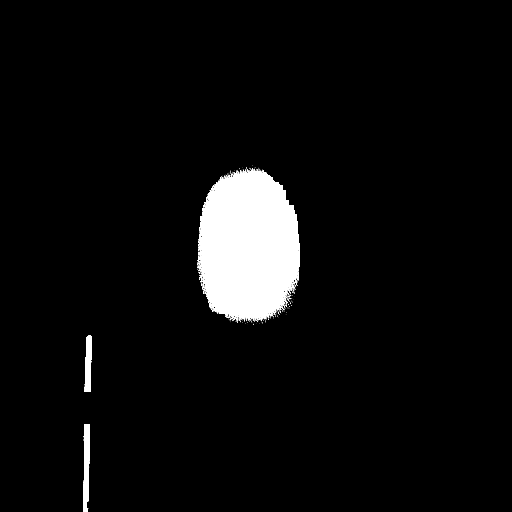

[15 of 30 positions shown; findings below may reference images not displayed]

FINDINGS: There is 7 mm high-density nodule in the roof of left maxillary
sinus.

Atherosclerotic calcifications of carotid siphon. No intracranial
hemorrhage, mass effect or midline shift. Small lacunar infarct is
noted in right basal ganglia. Moderate cerebral atrophy.
Periventricular and patchy subcortical white matter decreased
attenuation probable due to chronic small vessel ischemic changes.
No definite acute cortical infarction. No mass lesion is noted on
this unenhanced scan.
IMPRESSION: 1. No acute intracranial abnormality. Moderate cerebral atrophy.
Periventricular and patchy subcortical white matter decreased
attenuation probable due to chronic small vessel ischemic changes.
There is probable partially calcified 7 mm nodule in the roof of
left maxillary sinus.
2. No definite acute cortical infarction. Small lacunar infarct in
right basal ganglia.

## 2016-11-15 IMAGING — CR DG KNEE COMPLETE 4+V*R*
4 series · 4 of 4 positions shown · non-contrast
Comparison: None.

CLINICAL DATA: Confusion.  Fell.  Swelling and pain.

EXAM:
RIGHT KNEE - COMPLETE 4+ VIEW

[t knee ap right]
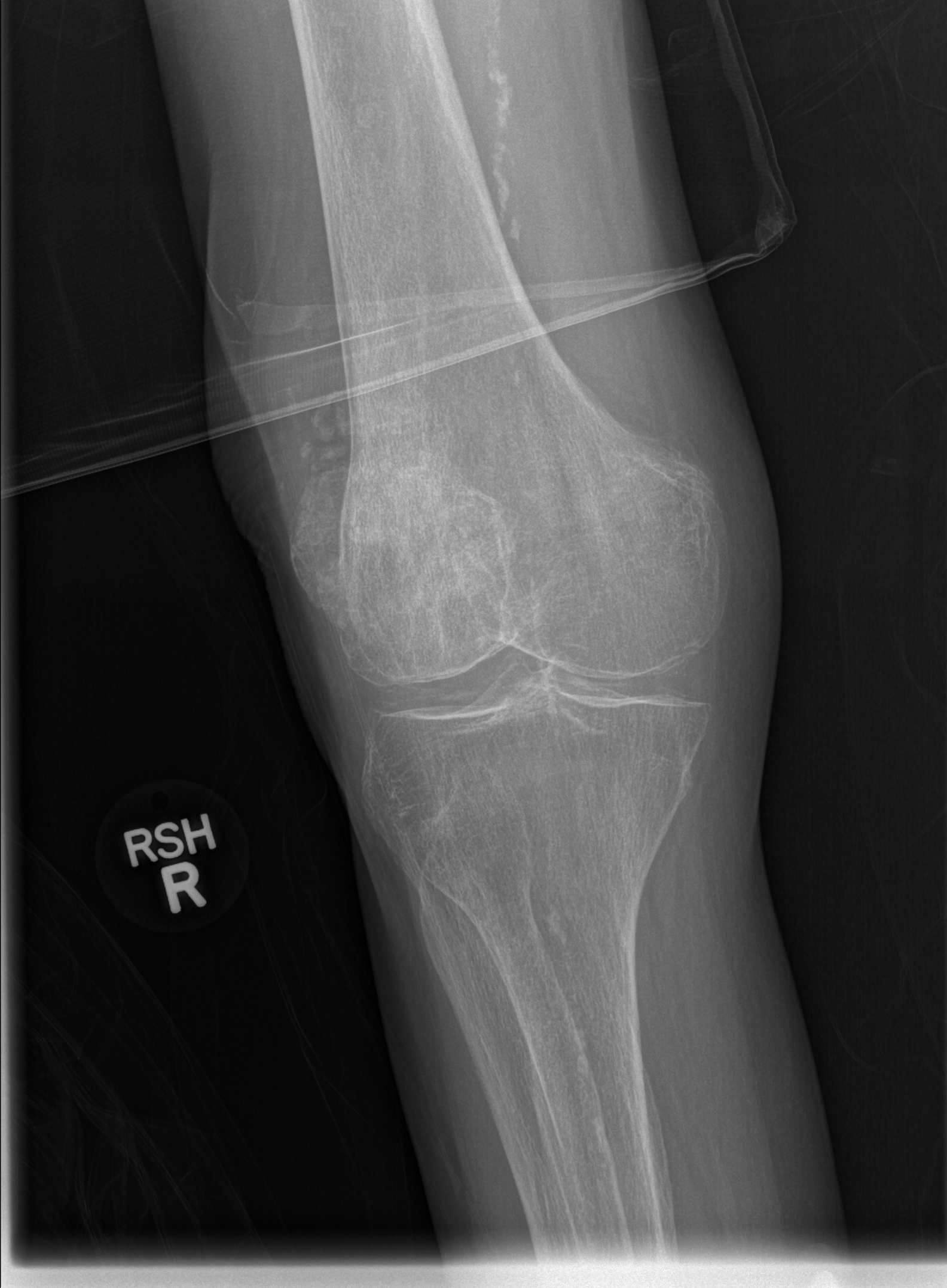

[t knee obl right (1 of 2)]
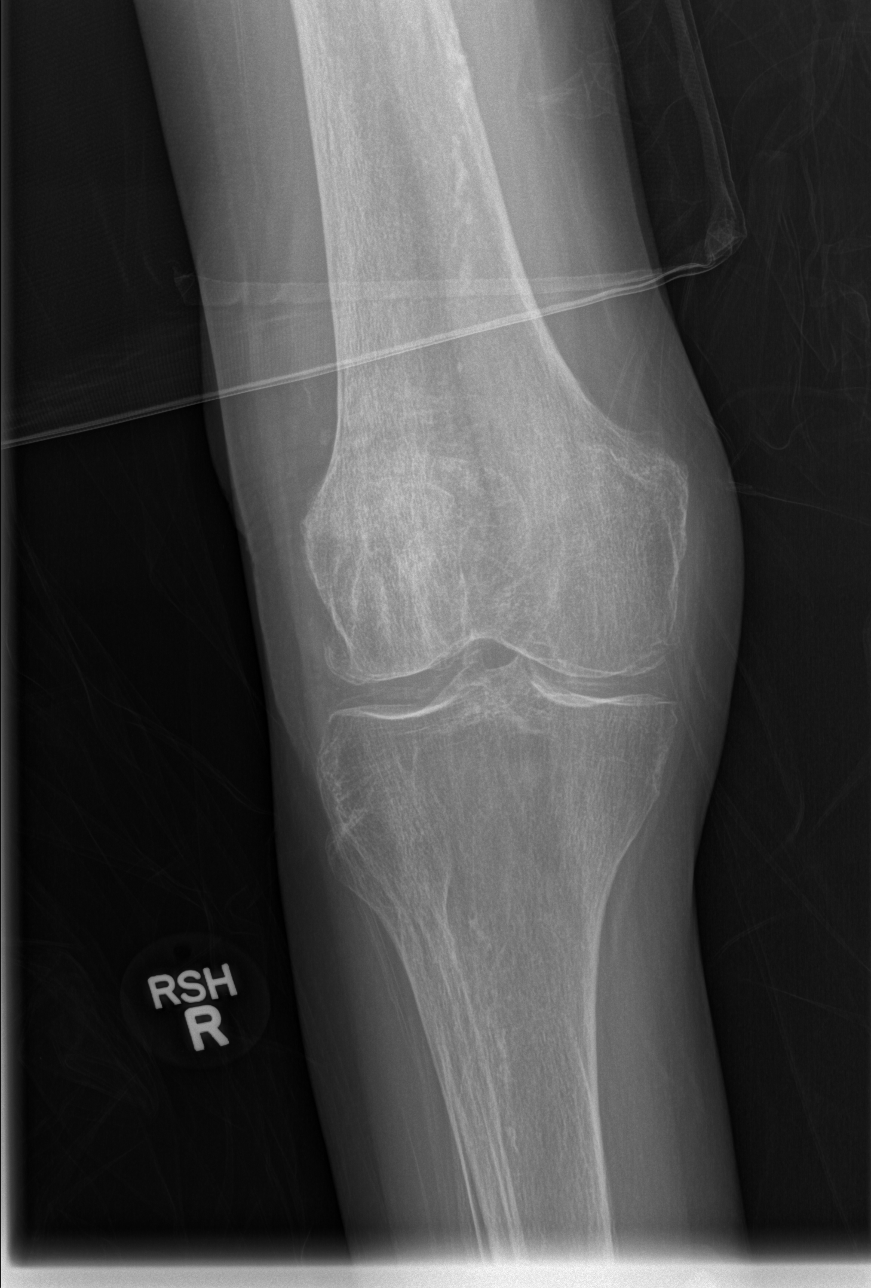

[t knee obl right (2 of 2)]
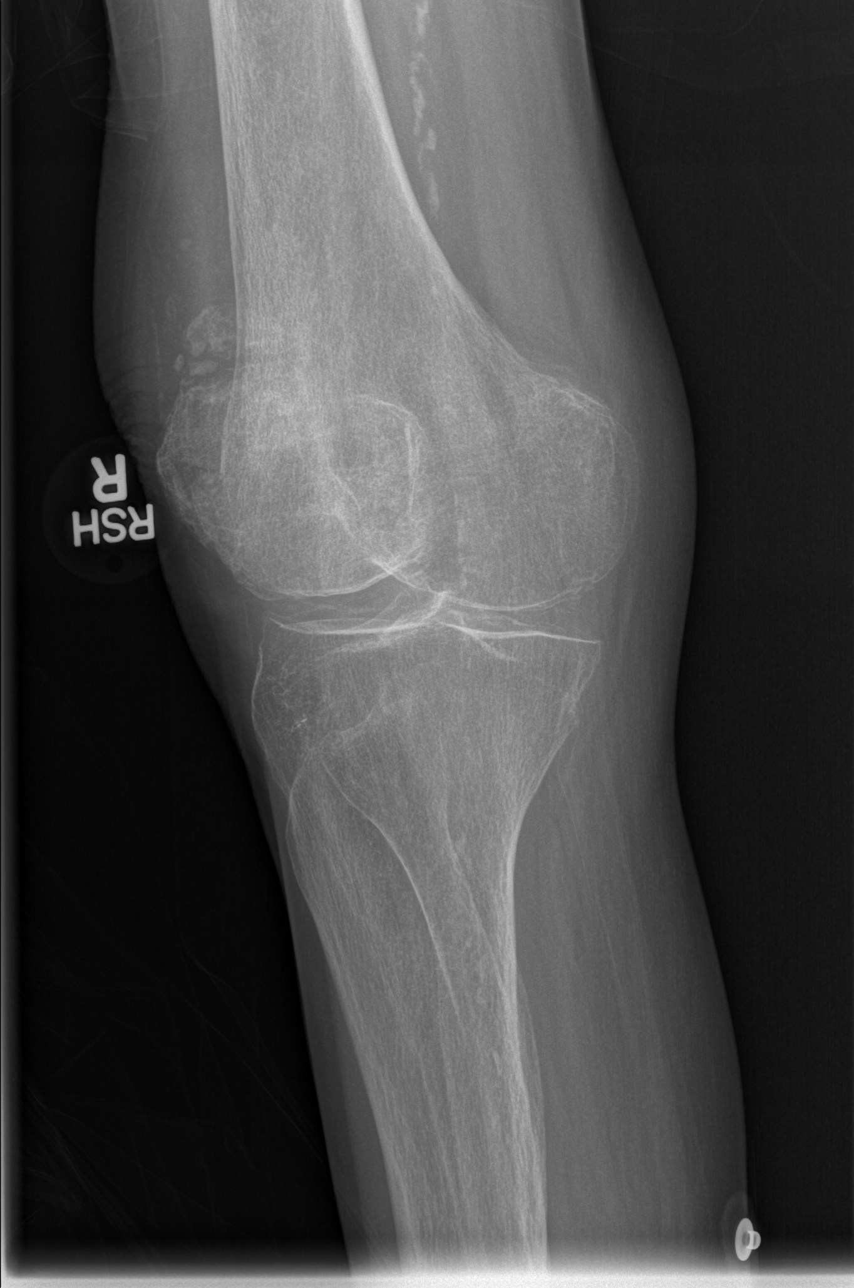

[x knee lat right]
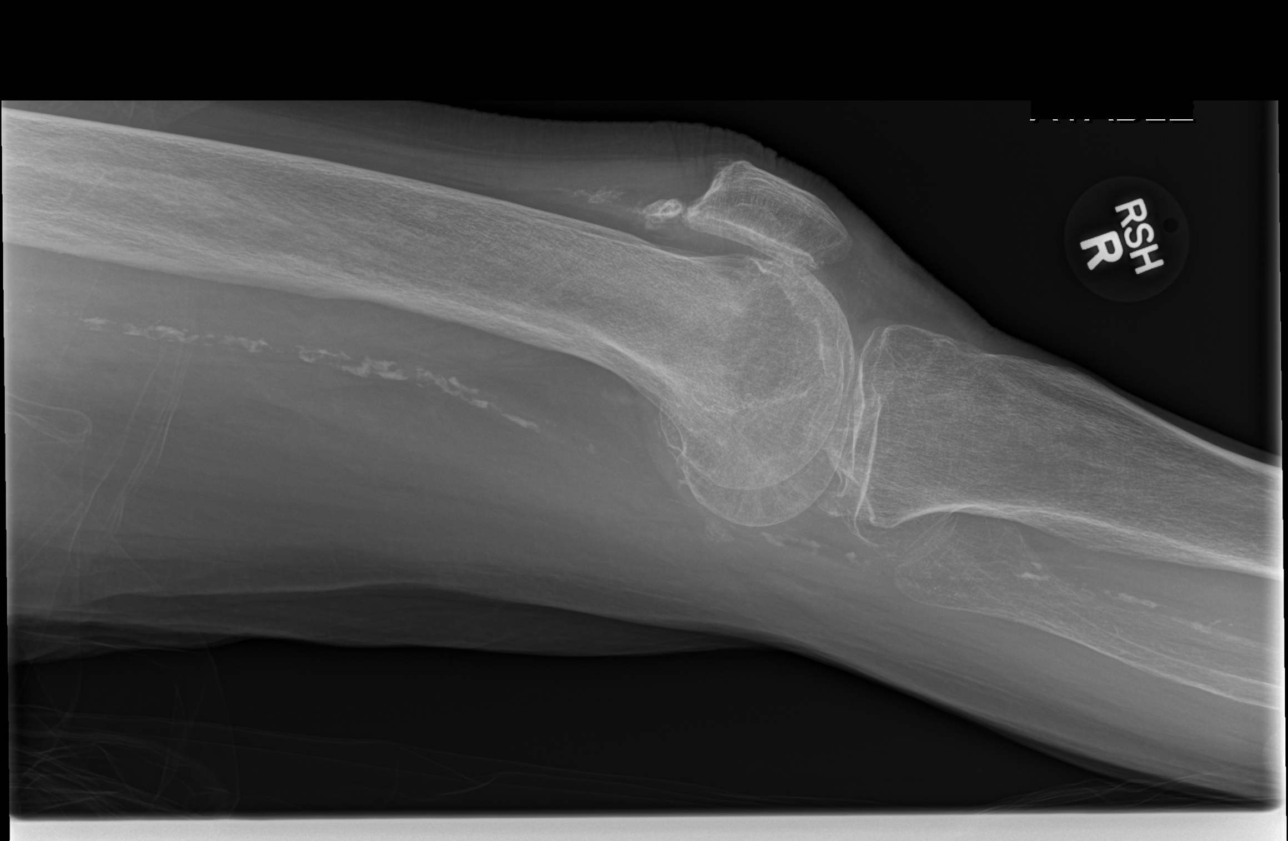

[4 of 4 positions shown; findings below may reference images not displayed]

FINDINGS: The bones are osteopenic. No joint effusion. There is chronic
calcification in the distal quadriceps tendon and possibly in the
suprapatellar recess of the joint. No sign of acute fracture of the
patella or extensor mechanism. There is chronic chondrocalcinosis
and chronic vascular calcification. No acute bony injury.
IMPRESSION: No acute injury seen. Chronic appearing calcification in the
quadriceps/patellar extensor mechanism region. No suspicion of acute
fracture. No joint effusion. There could be small intra-articular
loose bodies in the suprapatellar recess.
